# Patient Record
Sex: Male | Born: 1935 | Race: White | Hispanic: No | Smoking: Current every day smoker
Health system: Southern US, Community
[De-identification: ages and names within clinical notes are randomized; demographics above are authoritative.]

## PROBLEM LIST (undated history)

## (undated) DIAGNOSIS — M4800 Spinal stenosis, site unspecified: Secondary | ICD-10-CM

## (undated) DIAGNOSIS — I2510 Atherosclerotic heart disease of native coronary artery without angina pectoris: Secondary | ICD-10-CM

## (undated) DIAGNOSIS — E1149 Type 2 diabetes mellitus with other diabetic neurological complication: Secondary | ICD-10-CM

## (undated) DIAGNOSIS — F329 Major depressive disorder, single episode, unspecified: Secondary | ICD-10-CM

## (undated) DIAGNOSIS — F0391 Unspecified dementia with behavioral disturbance: Secondary | ICD-10-CM

## (undated) DIAGNOSIS — F0151 Vascular dementia with behavioral disturbance: Secondary | ICD-10-CM

## (undated) DIAGNOSIS — F32A Depression, unspecified: Secondary | ICD-10-CM

## (undated) DIAGNOSIS — G609 Hereditary and idiopathic neuropathy, unspecified: Secondary | ICD-10-CM

## (undated) DIAGNOSIS — R404 Transient alteration of awareness: Secondary | ICD-10-CM

## (undated) DIAGNOSIS — N400 Enlarged prostate without lower urinary tract symptoms: Secondary | ICD-10-CM

## (undated) DIAGNOSIS — I10 Essential (primary) hypertension: Secondary | ICD-10-CM

## (undated) DIAGNOSIS — F03918 Unspecified dementia, unspecified severity, with other behavioral disturbance: Secondary | ICD-10-CM

## (undated) DIAGNOSIS — Z720 Tobacco use: Secondary | ICD-10-CM

## (undated) DIAGNOSIS — J449 Chronic obstructive pulmonary disease, unspecified: Secondary | ICD-10-CM

## (undated) DIAGNOSIS — J4489 Other specified chronic obstructive pulmonary disease: Secondary | ICD-10-CM

## (undated) DIAGNOSIS — R413 Other amnesia: Secondary | ICD-10-CM

## (undated) DIAGNOSIS — E119 Type 2 diabetes mellitus without complications: Secondary | ICD-10-CM

## (undated) DIAGNOSIS — F0153 Vascular dementia, unspecified severity, with mood disturbance: Secondary | ICD-10-CM

## (undated) DIAGNOSIS — F419 Anxiety disorder, unspecified: Secondary | ICD-10-CM

## (undated) DIAGNOSIS — F0150 Vascular dementia without behavioral disturbance: Secondary | ICD-10-CM

## (undated) DIAGNOSIS — F0390 Unspecified dementia without behavioral disturbance: Secondary | ICD-10-CM

## (undated) DIAGNOSIS — I219 Acute myocardial infarction, unspecified: Secondary | ICD-10-CM

## (undated) HISTORY — DX: Other amnesia: R41.3

## (undated) HISTORY — DX: Benign prostatic hyperplasia without lower urinary tract symptoms: N40.0

## (undated) HISTORY — DX: Chronic obstructive pulmonary disease, unspecified: J44.9

## (undated) HISTORY — DX: Unspecified dementia with behavioral disturbance: F03.91

## (undated) HISTORY — DX: Spinal stenosis, site unspecified: M48.00

## (undated) HISTORY — DX: Transient alteration of awareness: R40.4

## (undated) HISTORY — DX: Major depressive disorder, single episode, unspecified: F32.9

## (undated) HISTORY — DX: Vascular dementia without behavioral disturbance: F01.50

## (undated) HISTORY — DX: Hereditary and idiopathic neuropathy, unspecified: G60.9

## (undated) HISTORY — DX: Unspecified dementia, unspecified severity, with other behavioral disturbance: F03.918

## (undated) HISTORY — DX: Other specified chronic obstructive pulmonary disease: J44.89

## (undated) HISTORY — PX: CATARACT EXTRACTION: SUR2

## (undated) HISTORY — DX: Vascular dementia, unspecified severity, with mood disturbance: F01.53

## (undated) HISTORY — DX: Vascular dementia with behavioral disturbance: F01.51

## (undated) HISTORY — DX: Type 2 diabetes mellitus with other diabetic neurological complication: E11.49

## (undated) HISTORY — DX: Depression, unspecified: F32.A

## (undated) HISTORY — PX: CORONARY STENT PLACEMENT: SHX1402

---

## 2001-06-06 HISTORY — PX: LAMINECTOMY THORACIC FUSION POSTERIOR: SHX5887

## 2007-06-07 HISTORY — PX: CARDIAC CATHETERIZATION: SHX172

## 2011-10-05 ENCOUNTER — Emergency Department (HOSPITAL_COMMUNITY): Payer: Medicare HMO

## 2011-10-05 ENCOUNTER — Emergency Department (HOSPITAL_COMMUNITY)
Admission: EM | Admit: 2011-10-05 | Discharge: 2011-10-05 | Disposition: A | Discharge status: Home / Self Care | Payer: Medicare HMO | Attending: Emergency Medicine | Admitting: Emergency Medicine

## 2011-10-05 DIAGNOSIS — M545 Low back pain, unspecified: Secondary | ICD-10-CM | POA: Insufficient documentation

## 2011-10-05 DIAGNOSIS — R319 Hematuria, unspecified: Secondary | ICD-10-CM | POA: Insufficient documentation

## 2011-10-05 DIAGNOSIS — R5381 Other malaise: Secondary | ICD-10-CM | POA: Insufficient documentation

## 2011-10-05 DIAGNOSIS — M79609 Pain in unspecified limb: Secondary | ICD-10-CM | POA: Insufficient documentation

## 2011-10-05 DIAGNOSIS — R29898 Other symptoms and signs involving the musculoskeletal system: Secondary | ICD-10-CM | POA: Insufficient documentation

## 2011-10-05 DIAGNOSIS — G8929 Other chronic pain: Secondary | ICD-10-CM | POA: Insufficient documentation

## 2011-10-05 LAB — URINE MICROSCOPIC-ADD ON

## 2011-10-05 LAB — BASIC METABOLIC PANEL
Calcium: 9.3 mg/dL (ref 8.4–10.5)
GFR calc non Af Amer: 83 mL/min — ABNORMAL LOW (ref >90)
Sodium: 144 mEq/L (ref 135–145)

## 2011-10-05 LAB — CBC
MCH: 31 pg (ref 26.0–34.0)
MCV: 91.3 fL (ref 78.0–100.0)
Platelets: 277 10*3/uL (ref 150–400)
RDW: 16.3 % — ABNORMAL HIGH (ref 11.5–15.5)
WBC: 9.1 10*3/uL (ref 4.0–10.5)

## 2011-10-05 LAB — URINALYSIS, ROUTINE W REFLEX MICROSCOPIC
Protein, ur: 30 mg/dL — AB
Urobilinogen, UA: 1 mg/dL (ref 0.0–1.0)

## 2011-10-05 LAB — DIFFERENTIAL
Basophils Absolute: 0.1 10*3/uL (ref 0.0–0.1)
Eosinophils Absolute: 0.2 10*3/uL (ref 0.0–0.7)
Eosinophils Relative: 2 % (ref 0–5)

## 2011-10-05 MED ORDER — HYDROMORPHONE HCL PF 1 MG/ML IJ SOLN
1.0000 mg | Freq: Once | INTRAMUSCULAR | Status: AC
  Administered 2011-10-05: 1 mg via INTRAMUSCULAR
  Filled 2011-10-05: qty 1

## 2011-10-05 MED ORDER — HYDROCODONE-ACETAMINOPHEN 5-325 MG PO TABS: 1.0000 | ORAL_TABLET | Freq: Four times a day (QID) | ORAL | Status: AC | PRN

## 2011-10-05 MED ORDER — KETOROLAC TROMETHAMINE 30 MG/ML IJ SOLN
30.0000 mg | Freq: Once | INTRAMUSCULAR | Status: AC
  Administered 2011-10-05: 30 mg via INTRAMUSCULAR
  Filled 2011-10-05: qty 1

## 2011-10-05 NOTE — ED Provider Notes (Signed)
History     CSN: [id number]  Arrival date & time 10/05/11  1500   First MD Initiated Contact with Patient 10/05/11 1521      Chief Complaint  Patient presents with  . Weakness    (Consider location/radiation/quality/duration/timing/severity/associated sxs/prior treatment) HPI History provided by patient and his son.  Patient's son reports that pt has had chronic low back pain for the past 20 years for which he has had multiple surgeries.  Over the past month, his pain has become progressively worse, more prominent on left w/ radiation down left leg, and is associated w/ LE weakness.  He is very unsteady on his feet, even with his walker.  He witnessed him collapse in the parking lot of store today, and it appeared that his legs gave out on him.  Pt says that his legs have given out on him in the past but infrequently.  No associated fever, bowel/bladder dysfunction or LE paresthesias.  Denies having weakness anywhere else.  Denies dizziness and vision changes.  No recent illnesses including cough, vomiting, diarrhea, dysuria.  Pt is on multiple pain medications.   No past medical history on file.  No past surgical history on file.  No family history on file.  History  Substance Use Topics  . Smoking status: Not on file  . Smokeless tobacco: Not on file  . Alcohol Use: Not on file      Review of Systems  All other systems reviewed and are negative.    Allergies  Review of patient's allergies indicates not on file.  Home Medications  No current outpatient prescriptions on file.  BP 114/57  Pulse 72  Temp(Src) 99.4 F (37.4 C) (Oral)  Resp 22  SpO2 94%  Physical Exam  Nursing note and vitals reviewed. Constitutional: He is oriented to person, place, and time. He appears well-developed and well-nourished.  HENT:  Head: Normocephalic and atraumatic.  Eyes:       Normal appearance  Neck: Normal range of motion.  Cardiovascular: Normal rate and regular rhythm.     Pulmonary/Chest: Effort normal and breath sounds normal.  Genitourinary:       No CVA ttp  Musculoskeletal:       Lumbar spinal and paraspinal ttp. Full active ROM of LE w/out increased back pain.  Decreased patellar reflexes.  No saddle anesthesia.  Distal sensation intact.  2+ DP pulses.  Nml rectal tone.  Neurological: He is alert and oriented to person, place, and time.  Skin: Skin is warm and dry. No rash noted.  Psychiatric: He has a normal mood and affect. His behavior is normal.    ED Course  Procedures (including critical care time)  Labs Reviewed  URINALYSIS, ROUTINE W REFLEX MICROSCOPIC - Abnormal; Notable for the following:    APPearance CLOUDY (*)    Hgb urine dipstick MODERATE (*)    Protein, ur 30 (*)    All other components within normal limits  CBC - Abnormal; Notable for the following:    HCT 38.6 (*)    RDW 16.3 (*)    All other components within normal limits  DIFFERENTIAL - Abnormal; Notable for the following:    Monocytes Relative 13 (*)    Monocytes Absolute 1.2 (*)    All other components within normal limits  BASIC METABOLIC PANEL - Abnormal; Notable for the following:    GFR calc non Af Amer 83 (*)    All other components within normal limits  URINE MICROSCOPIC-ADD ON  Mr Lumbar Spine Wo Contrast  10/05/2011  *RADIOLOGY REPORT*  Clinical Data: Fall.  Low back pain.  Left leg weakness  MRI LUMBAR SPINE WITHOUT CONTRAST  Technique:  Multiplanar and multiecho pulse sequences of the lumbar spine were obtained without intravenous contrast.  Comparison: None.  Findings: Negative for fracture.  No bone marrow or soft tissue edema is seen.  Negative for mass lesion.  Conus medullaris is normal and terminates at L1.  T12-L1:  Mild disc and mild facet degeneration without spinal stenosis.  L1-2:  Diffuse disc bulging and spondylosis.  Moderate facet hypertrophy bilaterally with moderate spinal stenosis.  Mild foraminal narrowing bilaterally.  L2-3:  Diffuse bulging  of the disc with associated vertebral spurring.  Moderate facet and ligamentum flavum hypertrophy with moderately severe central canal stenosis.  Moderate lateral recess and foraminal stenosis bilaterally.  L3-4:  11 mm anterior slip.  Advanced disc degeneration and spondylosis.  Moderate to severe spinal stenosis.  Moderate lateral recess and foraminal encroachment bilaterally.  L4-5:  15 mm anterior slip with  bilateral pars defect of L4. Diffuse disc bulging and vertebral spurring.  Moderate spinal stenosis.  Lateral recess and foraminal encroachment bilaterally which is moderate to severe in nature.  L5-S1:  Disc and facet degeneration without significant disc protrusion or spinal stenosis.  IMPRESSION: Moderate spinal stenosis at L1-2 and L2-3.  Moderate to severe spinal stenosis at L3-4 with 11 mm anterior slip.  Lateral recess and foraminal encroachment bilaterally.  15 mm anterior slip L4-L5 with bilateral pars defects of L4. Moderate spinal stenosis and moderate to severe foraminal encroachment bilaterally.  Original Report Authenticated By: Camelia Phenes, M.D.     1. Low back pain   2. Hematuria       MDM  78yo M w/ chronic low back pain presents w/ acutely worsened pain x 1 month w/ increasing LE weakness.  His family reports that he is very unsteady on his feet, especially over the last 48hrs.  Had a witnessed fall in which his legs gave out on him today.  Pt initially denied urinary/bowel sx but stated later that he is has had urinary retention for the past couple months.  On exam, no fever, mid-line spinal tenderness, NV deficits or decreased rectal tone.  Patellar tendons hyporeflexive bilaterally.   Pt received IM toradol and dilaudid for pain.  He reports being comfortable when is not attempting to ambulate.  MRI lumbar spine ordered to r/o spinal stenosis and cauda equina and Dr. Brooke Dare has ordered basic labs as well.    Labs sig for hematuria.  Will instruct pt to f/u with PCP for  recheck.  MRI shows spinal stenosis throughout lumbar spine, moderate to severe between L3 and L5, + mod-sev bilateral foraminal encroachment L4-L5. Results discussed w/ pt and his son.  Pt is eager to go home.   Will ambulate and if he does well, will d/c home w/ vicodin for pain and referral to NS. He understands the importance of close f/u.  Return precautions discussed.  Kirichenko, PA-C to dispo.          Otilio Miu, [place] 10/06/11 [phone]

## 2011-10-05 NOTE — ED Notes (Signed)
First contact with pt. Pt resting in bed quietly. Pt's son is at bedside. Pt is alert at this time. Pt requesting pain medicine at this time. Pt notified and aware of need for md order

## 2011-10-05 NOTE — ED Notes (Signed)
[id number]> Expected date:<BR> Expected time:<BR> Means of arrival:<BR> Comments:<BR> From Triage

## 2011-10-05 NOTE — ED Notes (Signed)
Pt son states he will be in the cafeteria and if we need him to call him Zagal) at [phone]

## 2011-10-05 NOTE — ED Notes (Signed)
Pt awaiting mri

## 2011-10-05 NOTE — ED Notes (Addendum)
Family brings pt from out shoping, pt lives in [place] [address] [place], family states pt has been increasingly deteriorating for past month, family states pt has trouble walking pt co of weakness. Pt fell today from standing position in parking lot while shopping on to left side, pt co of left hip and foot pain. Family states pt legs just gave out.

## 2011-10-05 NOTE — Discharge Instructions (Signed)
Take vicodin as prescribed for severe pain.   Do not drive within four hours of taking this medication (may cause drowsiness or confusion).  Follow up with Dr. Newell Coral as soon as possible.  You should return to the ER if you develop fever, increased weakness in legs or loss of control of bladder/bowels.   Please follow up with your primary care doctor as well for red blood cells in your urine. You should have this rechecked.

## 2011-10-05 NOTE — ED Notes (Signed)
Unable to obtain patient's vital signs due to patient in MRI. Will attempt another time

## 2011-10-05 NOTE — ED Notes (Signed)
Pt transported to mri 

## 2011-10-06 NOTE — ED Provider Notes (Signed)
Medical screening examination/treatment/procedure(s) were conducted as a shared visit with non-physician practitioner(s) and myself.  I personally evaluated the patient during the encounter  Back pain with intermittent leg weakness resulting in falls bilaterally.  Has had some urinary retention as well.  MR with spinal and foraminal stenosis.  Was able to walk around ed at baseline.  Refuses surgical interventions.  Will instruct to follow up with NS  Dayton Bailiff, MD 10/06/11 2038

## 2012-05-04 ENCOUNTER — Encounter (HOSPITAL_COMMUNITY): Payer: Self-pay | Admitting: Emergency Medicine

## 2012-05-04 ENCOUNTER — Emergency Department (HOSPITAL_COMMUNITY): Payer: Medicare HMO

## 2012-05-04 ENCOUNTER — Emergency Department (HOSPITAL_COMMUNITY)
Admission: EM | Admit: 2012-05-04 | Discharge: 2012-05-04 | Disposition: A | Discharge status: Home / Self Care | Payer: Medicare HMO | Attending: Emergency Medicine | Admitting: Emergency Medicine

## 2012-05-04 DIAGNOSIS — H5789 Other specified disorders of eye and adnexa: Secondary | ICD-10-CM | POA: Insufficient documentation

## 2012-05-04 DIAGNOSIS — J3489 Other specified disorders of nose and nasal sinuses: Secondary | ICD-10-CM | POA: Insufficient documentation

## 2012-05-04 DIAGNOSIS — E119 Type 2 diabetes mellitus without complications: Secondary | ICD-10-CM | POA: Insufficient documentation

## 2012-05-04 DIAGNOSIS — F17200 Nicotine dependence, unspecified, uncomplicated: Secondary | ICD-10-CM | POA: Insufficient documentation

## 2012-05-04 DIAGNOSIS — I2510 Atherosclerotic heart disease of native coronary artery without angina pectoris: Secondary | ICD-10-CM | POA: Insufficient documentation

## 2012-05-04 DIAGNOSIS — I10 Essential (primary) hypertension: Secondary | ICD-10-CM | POA: Insufficient documentation

## 2012-05-04 DIAGNOSIS — I252 Old myocardial infarction: Secondary | ICD-10-CM | POA: Insufficient documentation

## 2012-05-04 DIAGNOSIS — F0390 Unspecified dementia without behavioral disturbance: Secondary | ICD-10-CM | POA: Insufficient documentation

## 2012-05-04 DIAGNOSIS — R05 Cough: Secondary | ICD-10-CM

## 2012-05-04 DIAGNOSIS — Z79899 Other long term (current) drug therapy: Secondary | ICD-10-CM | POA: Insufficient documentation

## 2012-05-04 DIAGNOSIS — R059 Cough, unspecified: Secondary | ICD-10-CM | POA: Insufficient documentation

## 2012-05-04 HISTORY — DX: Major depressive disorder, single episode, unspecified: F32.9

## 2012-05-04 HISTORY — DX: Type 2 diabetes mellitus without complications: E11.9

## 2012-05-04 HISTORY — DX: Depression, unspecified: F32.A

## 2012-05-04 HISTORY — DX: Anxiety disorder, unspecified: F41.9

## 2012-05-04 HISTORY — DX: Atherosclerotic heart disease of native coronary artery without angina pectoris: I25.10

## 2012-05-04 HISTORY — DX: Tobacco use: Z72.0

## 2012-05-04 HISTORY — DX: Essential (primary) hypertension: I10

## 2012-05-04 HISTORY — DX: Unspecified dementia, unspecified severity, without behavioral disturbance, psychotic disturbance, mood disturbance, and anxiety: F03.90

## 2012-05-04 HISTORY — DX: Acute myocardial infarction, unspecified: I21.9

## 2012-05-04 LAB — CBC WITH DIFFERENTIAL/PLATELET
Eosinophils Relative: 2 % (ref 0–5)
HCT: 37.4 % — ABNORMAL LOW (ref 39.0–52.0)
Lymphocytes Relative: 22 % (ref 12–46)
Lymphs Abs: 1.9 10*3/uL (ref 0.7–4.0)
MCV: 91.4 fL (ref 78.0–100.0)
Neutro Abs: 5.4 10*3/uL (ref 1.7–7.7)
Platelets: 261 10*3/uL (ref 150–400)
RBC: 4.09 MIL/uL — ABNORMAL LOW (ref 4.22–5.81)
WBC: 8.7 10*3/uL (ref 4.0–10.5)

## 2012-05-04 LAB — COMPREHENSIVE METABOLIC PANEL
Albumin: 3.9 g/dL (ref 3.5–5.2)
BUN: 15 mg/dL (ref 6–23)
Creatinine, Ser: 0.78 mg/dL (ref 0.50–1.35)
Total Bilirubin: 0.3 mg/dL (ref 0.3–1.2)
Total Protein: 6.8 g/dL (ref 6.0–8.3)

## 2012-05-04 LAB — POCT I-STAT TROPONIN I
Troponin i, poc: 0 ng/mL (ref 0.00–0.08)
Troponin i, poc: 0.01 ng/mL (ref 0.00–0.08)

## 2012-05-04 NOTE — ED Notes (Signed)
PT. ARRIVE WITH EMS FROM [facility] REPORTS SOB WITH NASAL CONGESTION AND OCCASIONAL PRODUCTIVE COUGH WITH CHEST TIGHTNESS , DENIES CHEST PAIN , POOR HISTORIAN , NO MEDS PRIOR TO ARRIVAL .

## 2012-05-04 NOTE — ED Notes (Signed)
MD at bedside. 

## 2012-05-04 NOTE — ED Notes (Signed)
TRANSPORTED TO X-RAY. 

## 2012-05-04 NOTE — ED Provider Notes (Signed)
History     CSN: [id number]  Arrival date & time 05/04/12  6295   None     Chief Complaint  Patient presents with  . Shortness of Breath    (Consider location/radiation/quality/duration/timing/severity/associated sxs/prior treatment) HPI The patient presents to the ED with congestion. Additional history was provided by the patient's wife due to patients history of dementia. The patient reports watching a football game last night and when smoking a cigarette he had some throat irritation due to post nasal drip.  The non-productive coughing started this morning.  He reports R eye clear discharge.  Denies chest pain or SOB.  Denies fever or chills, nausea or vomiting.  Patients is currently at his baseline mentation per patient's wife.    Past Medical History  Diagnosis Date  . Tobacco abuse   . Coronary artery disease   . MI (myocardial infarction)   . Dementia   . Diabetes mellitus without complication   . Hypertension     Past Surgical History  Procedure Date  . Coronary stent placement     No family history on file.  History  Substance Use Topics  . Smoking status: Current Every Day Smoker  . Smokeless tobacco: Not on file  . Alcohol Use: No      Review of Systems All other systems negative except as documented in the HPI. All pertinent positives and negatives as reviewed in the HPI.  Allergies  Review of patient's allergies indicates not on file.  Home Medications   Current Outpatient Rx  Name  Route  Sig  Dispense  Refill  . ATENOLOL 50 MG PO TABS   Oral   Take 50 mg by mouth daily.         . ATORVASTATIN CALCIUM 80 MG PO TABS   Oral   Take 80 mg by mouth daily.         Marland Kitchen BACLOFEN 10 MG PO TABS   Oral   Take 10 mg by mouth every 6 (six) hours.         Marland Kitchen VITAMIN D 1000 UNITS PO TABS   Oral   Take 1,000 Units by mouth 2 (two) times daily.          Marland Kitchen CITALOPRAM HYDROBROMIDE 20 MG PO TABS   Oral   Take 20 mg by mouth daily.         Marland Kitchen  CLOPIDOGREL BISULFATE 75 MG PO TABS   Oral   Take 75 mg by mouth daily.         Marland Kitchen GLIMEPIRIDE 2 MG PO TABS   Oral   Take 2 mg by mouth daily before breakfast.         . METFORMIN HCL 1000 MG PO TABS   Oral   Take 1,000 mg by mouth 2 (two) times daily with a meal.         . NAPROXEN SODIUM 220 MG PO TABS   Oral   Take 440 mg by mouth 2 (two) times daily with a meal.          . MELATONIN PO   Oral   Take 1 tablet by mouth at bedtime.         Marland Kitchen PIOGLITAZONE HCL 30 MG PO TABS   Oral   Take 30 mg by mouth daily.         Marland Kitchen TIOTROPIUM BROMIDE MONOHYDRATE 18 MCG IN CAPS   Inhalation   Place 18 mcg into inhaler and inhale daily. For shortness  of breath.         . TRAMADOL HCL 50 MG PO TABS   Oral   Take 50 mg by mouth every 6 (six) hours as needed. For pain.         Marland Kitchen ZINC SULFATE 220 MG PO CAPS   Oral   Take 220 mg by mouth daily.           There were no vitals taken for this visit.  Physical Exam  Vitals reviewed. Constitutional: He appears well-developed and well-nourished.  HENT:  Head: Normocephalic and atraumatic.  Nose: Rhinorrhea present.  Mouth/Throat: No oropharyngeal exudate or posterior oropharyngeal edema.       Multiple abrasions on various healing stages on nose, perioral, and central forehead.  L TM obscured to cerumen  Cardiovascular: Normal rate, regular rhythm, S1 normal and S2 normal.  Exam reveals no S3, no S4, no distant heart sounds and no friction rub.   Murmur heard.  Systolic murmur is present with a grade of 2/6       Early systolic at the [place]  Pulmonary/Chest: Effort normal and breath sounds normal. No accessory muscle usage. Not tachypneic. No respiratory distress. He has no decreased breath sounds. He has no wheezes. He has no rhonchi. He has no rales.  Abdominal: Soft. Normal appearance. There is no tenderness. There is no rigidity and no guarding.  Lymphadenopathy:       Head (right side): No submental, no submandibular,  no tonsillar and no occipital adenopathy present.       Head (left side): No submental, no submandibular, no tonsillar and no occipital adenopathy present.    He has no cervical adenopathy.  Neurological: He is alert.       Oriented to person  Skin: Skin is warm and dry.    ED Course  Procedures (including critical care time)  The patient has been having no evidence of SOB or hypoxia here in the ER. The patient has cardiac disease but based on the HPI the patient is not having symptoms that would be the same symptoms he had previously with his heart related issues. The patient denies SOB or chest pain. The patient does have two negative enzymes. The patient does not have any history that would concern me for PE at this time. The wife is asked to follow up with his doctor. The patient had nasal congestion and drainage and then when he coughed the patient has clear phlegm.    MDM    MDM Reviewed: nursing note, vitals and previous chart Reviewed previous: labs and ECG Interpretation: labs, ECG and x-ray    Date: 05/04/2012  Rate: 62  Rhythm: normal sinus rhythm  QRS Axis: normal  Intervals: normal  ST/T Wave abnormalities: normal  Conduction Disutrbances:right bundle branch block  Narrative Interpretation:   Old EKG Reviewed: unchanged         Carlyle Dolly, PA-C 05/04/12 1052

## 2012-05-05 NOTE — ED Provider Notes (Signed)
Medical screening examination/treatment/procedure(s) were performed by non-physician practitioner and as supervising physician I was immediately available for consultation/collaboration.  Danijela Vessey, MD 05/05/12 0653 

## 2012-09-04 ENCOUNTER — Ambulatory Visit (INDEPENDENT_AMBULATORY_CARE_PROVIDER_SITE_OTHER): Payer: Medicare Other | Admitting: Nurse Practitioner

## 2012-09-04 ENCOUNTER — Encounter: Payer: Self-pay | Admitting: Nurse Practitioner

## 2012-09-04 VITALS — BP 118/60 | HR 76 | Temp 97.7°F | Wt 182.0 lb

## 2012-09-04 DIAGNOSIS — M7989 Other specified soft tissue disorders: Secondary | ICD-10-CM | POA: Insufficient documentation

## 2012-09-04 DIAGNOSIS — F03918 Unspecified dementia, unspecified severity, with other behavioral disturbance: Secondary | ICD-10-CM

## 2012-09-04 DIAGNOSIS — F0391 Unspecified dementia with behavioral disturbance: Secondary | ICD-10-CM | POA: Insufficient documentation

## 2012-09-04 MED ORDER — RISPERIDONE 0.25 MG PO TABS: 0.2500 mg | ORAL_TABLET | Freq: Two times a day (BID) | ORAL | Status: DC
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
## 2012-09-04 NOTE — Assessment & Plan Note (Signed)
At this time will get xray of right wrist and hand. May splint hand and use ice as needed.
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
## 2012-10-01 ENCOUNTER — Other Ambulatory Visit: Payer: Self-pay | Admitting: *Deleted

## 2012-10-01 MED ORDER — METFORMIN HCL 1000 MG PO TABS: 1000.0000 mg | ORAL_TABLET | Freq: Two times a day (BID) | ORAL | Status: DC

## 2012-10-03 ENCOUNTER — Encounter: Payer: Self-pay | Admitting: Nurse Practitioner

## 2012-10-19 ENCOUNTER — Other Ambulatory Visit: Payer: Self-pay | Admitting: Internal Medicine

## 2012-10-19 ENCOUNTER — Other Ambulatory Visit: Payer: Self-pay | Admitting: Nurse Practitioner

## 2012-10-24 ENCOUNTER — Other Ambulatory Visit: Payer: Self-pay | Admitting: *Deleted

## 2012-10-24 MED ORDER — CLONAZEPAM 0.5 MG PO TABS: ORAL_TABLET | ORAL | Status: DC

## 2012-12-31 ENCOUNTER — Other Ambulatory Visit: Payer: Self-pay | Admitting: Geriatric Medicine

## 2013-01-08 ENCOUNTER — Other Ambulatory Visit: Payer: Self-pay | Admitting: Geriatric Medicine

## 2013-01-08 MED ORDER — TRAMADOL HCL 50 MG PO TABS: ORAL_TABLET | ORAL | Status: DC

## 2013-03-08 ENCOUNTER — Other Ambulatory Visit: Payer: Self-pay | Admitting: *Deleted

## 2013-04-01 ENCOUNTER — Encounter (HOSPITAL_COMMUNITY): Payer: Self-pay | Admitting: Emergency Medicine

## 2013-04-01 ENCOUNTER — Emergency Department (HOSPITAL_COMMUNITY): Payer: Medicare HMO

## 2013-04-01 ENCOUNTER — Emergency Department (HOSPITAL_COMMUNITY)
Admission: EM | Admit: 2013-04-01 | Discharge: 2013-04-01 | Disposition: A | Discharge status: Home / Self Care | Payer: Medicare HMO | Attending: Emergency Medicine | Admitting: Emergency Medicine

## 2013-04-01 DIAGNOSIS — Z79899 Other long term (current) drug therapy: Secondary | ICD-10-CM | POA: Insufficient documentation

## 2013-04-01 DIAGNOSIS — S60511A Abrasion of right hand, initial encounter: Secondary | ICD-10-CM

## 2013-04-01 DIAGNOSIS — F0153 Vascular dementia, unspecified severity, with mood disturbance: Secondary | ICD-10-CM | POA: Insufficient documentation

## 2013-04-01 DIAGNOSIS — I252 Old myocardial infarction: Secondary | ICD-10-CM | POA: Insufficient documentation

## 2013-04-01 DIAGNOSIS — Z9889 Other specified postprocedural states: Secondary | ICD-10-CM | POA: Insufficient documentation

## 2013-04-01 DIAGNOSIS — R296 Repeated falls: Secondary | ICD-10-CM | POA: Insufficient documentation

## 2013-04-01 DIAGNOSIS — N390 Urinary tract infection, site not specified: Secondary | ICD-10-CM | POA: Diagnosis present

## 2013-04-01 DIAGNOSIS — Z23 Encounter for immunization: Secondary | ICD-10-CM | POA: Insufficient documentation

## 2013-04-01 DIAGNOSIS — J449 Chronic obstructive pulmonary disease, unspecified: Secondary | ICD-10-CM | POA: Insufficient documentation

## 2013-04-01 DIAGNOSIS — W19XXXA Unspecified fall, initial encounter: Secondary | ICD-10-CM

## 2013-04-01 DIAGNOSIS — G609 Hereditary and idiopathic neuropathy, unspecified: Secondary | ICD-10-CM | POA: Insufficient documentation

## 2013-04-01 DIAGNOSIS — F411 Generalized anxiety disorder: Secondary | ICD-10-CM | POA: Insufficient documentation

## 2013-04-01 DIAGNOSIS — J4489 Other specified chronic obstructive pulmonary disease: Secondary | ICD-10-CM | POA: Insufficient documentation

## 2013-04-01 DIAGNOSIS — Z7902 Long term (current) use of antithrombotics/antiplatelets: Secondary | ICD-10-CM | POA: Insufficient documentation

## 2013-04-01 DIAGNOSIS — I2510 Atherosclerotic heart disease of native coronary artery without angina pectoris: Secondary | ICD-10-CM | POA: Insufficient documentation

## 2013-04-01 DIAGNOSIS — Y9389 Activity, other specified: Secondary | ICD-10-CM | POA: Insufficient documentation

## 2013-04-01 DIAGNOSIS — Y92009 Unspecified place in unspecified non-institutional (private) residence as the place of occurrence of the external cause: Secondary | ICD-10-CM | POA: Insufficient documentation

## 2013-04-01 DIAGNOSIS — I10 Essential (primary) hypertension: Secondary | ICD-10-CM | POA: Insufficient documentation

## 2013-04-01 DIAGNOSIS — S60519A Abrasion of unspecified hand, initial encounter: Secondary | ICD-10-CM

## 2013-04-01 DIAGNOSIS — R413 Other amnesia: Secondary | ICD-10-CM | POA: Insufficient documentation

## 2013-04-01 DIAGNOSIS — F0151 Vascular dementia with behavioral disturbance: Secondary | ICD-10-CM | POA: Insufficient documentation

## 2013-04-01 DIAGNOSIS — S60512A Abrasion of left hand, initial encounter: Secondary | ICD-10-CM

## 2013-04-01 DIAGNOSIS — Z9861 Coronary angioplasty status: Secondary | ICD-10-CM | POA: Insufficient documentation

## 2013-04-01 DIAGNOSIS — F329 Major depressive disorder, single episode, unspecified: Secondary | ICD-10-CM | POA: Insufficient documentation

## 2013-04-01 DIAGNOSIS — F17200 Nicotine dependence, unspecified, uncomplicated: Secondary | ICD-10-CM | POA: Insufficient documentation

## 2013-04-01 DIAGNOSIS — F3289 Other specified depressive episodes: Secondary | ICD-10-CM | POA: Insufficient documentation

## 2013-04-01 DIAGNOSIS — IMO0002 Reserved for concepts with insufficient information to code with codable children: Secondary | ICD-10-CM | POA: Insufficient documentation

## 2013-04-01 DIAGNOSIS — E1149 Type 2 diabetes mellitus with other diabetic neurological complication: Secondary | ICD-10-CM | POA: Insufficient documentation

## 2013-04-01 LAB — COMPREHENSIVE METABOLIC PANEL
Albumin: 3.3 g/dL — ABNORMAL LOW (ref 3.5–5.2)
BUN: 14 mg/dL (ref 6–23)
Calcium: 9.2 mg/dL (ref 8.4–10.5)
Creatinine, Ser: 0.64 mg/dL (ref 0.50–1.35)
Total Bilirubin: 0.3 mg/dL (ref 0.3–1.2)
Total Protein: 6.2 g/dL (ref 6.0–8.3)

## 2013-04-01 LAB — URINALYSIS W MICROSCOPIC + REFLEX CULTURE
Ketones, ur: NEGATIVE mg/dL
Nitrite: POSITIVE — AB
Specific Gravity, Urine: 1.022 (ref 1.005–1.030)
pH: 6.5 (ref 5.0–8.0)

## 2013-04-01 LAB — CBC WITH DIFFERENTIAL/PLATELET
Basophils Relative: 1 % (ref 0–1)
Eosinophils Absolute: 0.1 10*3/uL (ref 0.0–0.7)
Eosinophils Relative: 1 % (ref 0–5)
HCT: 36 % — ABNORMAL LOW (ref 39.0–52.0)
Hemoglobin: 12.3 g/dL — ABNORMAL LOW (ref 13.0–17.0)
MCH: 30.4 pg (ref 26.0–34.0)
MCHC: 34.2 g/dL (ref 30.0–36.0)
Monocytes Absolute: 1.2 10*3/uL — ABNORMAL HIGH (ref 0.1–1.0)
Monocytes Relative: 14 % — ABNORMAL HIGH (ref 3–12)
RDW: 15.6 % — ABNORMAL HIGH (ref 11.5–15.5)

## 2013-04-01 LAB — LIPASE, BLOOD: Lipase: 24 U/L (ref 11–59)

## 2013-04-01 MED ORDER — CEFPODOXIME PROXETIL 200 MG PO TABS
200.0000 mg | ORAL_TABLET | Freq: Once | ORAL | Status: DC
  Filled 2013-04-01: qty 1

## 2013-04-01 MED ORDER — CEFPODOXIME PROXETIL 200 MG PO TABS: 200.0000 mg | ORAL_TABLET | Freq: Two times a day (BID) | ORAL | Status: AC

## 2013-04-01 MED ORDER — TETANUS-DIPHTH-ACELL PERTUSSIS 5-2.5-18.5 LF-MCG/0.5 IM SUSP
0.5000 mL | Freq: Once | INTRAMUSCULAR | Status: AC
  Administered 2013-04-01: 0.5 mL via INTRAMUSCULAR
  Filled 2013-04-01: qty 0.5

## 2013-04-01 NOTE — Progress Notes (Signed)
   CARE MANAGEMENT ED NOTE 04/01/2013  Patient:  Jeremy Alvarez, Jeremy Alvarez   Account Number:  [id number]  Date Initiated:  04/01/2013  Documentation initiated by:  Edd Arbour  Subjective/Objective Assessment:   c/o fall at facility [facility] listed in facility information is dr Elmarie Shiley reed as pcp     Subjective/Objective Assessment Detail:     Action/Plan:   pt unable to provide assist to name of pcp, updated EPIC   Action/Plan Detail:   Anticipated DC Date:  04/01/2013     Status Recommendation to Physician:   Result of Recommendation:    Other ED Services  Consult Working Plan    DC Planning Services  Other  PCP issues  Outpatient Services - Pt will follow up    Choice offered to / List presented to:            Status of service:  Completed, signed off  ED Comments:   ED Comments Detail:

## 2013-04-01 NOTE — ED Notes (Signed)
Per EMS: Pt from [facility].  Was found on the floor.  Pt denies complaint.  No thinners.  Staff states he has had an awkward gait the last 5 days and and he has had yellowing of the left side of his head and hands that they want to get checked out.

## 2013-04-01 NOTE — ED Notes (Signed)
Pt trying to get up out of bed.  Assisted pt getting dressed and back into bed.  Informed pt we are waiting on radiology results before he can go home and/or eat.

## 2013-04-01 NOTE — ED Notes (Signed)
Bed: RESA Expected date:  Expected time:  Means of arrival:  Comments: Rollover, head/neck/back pain

## 2013-04-01 NOTE — ED Provider Notes (Signed)
CSN: [id number]     Arrival date & time 04/01/13  1015 History   First MD Initiated Contact with Patient 04/01/13 1030     Chief Complaint  Patient presents with  . Fall   (Consider location/radiation/quality/duration/timing/severity/associated sxs/prior Treatment) Patient is a 79 y.o. male presenting with fall. The history is provided by the patient.  Fall This is a new problem. The current episode started 1 to 2 hours ago. Episode frequency: once. The problem has been resolved. Pertinent negatives include no chest pain, no abdominal pain, no headaches and no shortness of breath. Nothing aggravates the symptoms. Nothing relieves the symptoms. He has tried nothing for the symptoms. The treatment provided no relief.    Past Medical History  Diagnosis Date  . Tobacco abuse   . Coronary artery disease   . MI (myocardial infarction)   . Dementia   . Diabetes mellitus without complication   . Hypertension   . Anxiety   . Depression   . Chronic airway obstruction, not elsewhere classified   . Unspecified hereditary and idiopathic peripheral neuropathy   . Spinal stenosis, unspecified region other than cervical   . Memory loss   . Type II or unspecified type diabetes mellitus with neurological manifestations, not stated as uncontrolled(250.60)   . Other alteration of consciousness   . Vascular dementia with depressed mood   . Hypertrophy of prostate without urinary obstruction and other lower urinary tract symptoms (LUTS)   . Dementia with behavioral disturbance    Past Surgical History  Procedure Laterality Date  . Coronary stent placement    . Cataract extraction      bilateral  . Laminectomy thoracic fusion posterior  2003  . Cardiac catheterization  2009   Family History  Problem Relation Age of Onset  . Cancer Son     Non Hodgkins, Lymphoma   History  Substance Use Topics  . Smoking status: Current Every Day Smoker -- 68 years    Types: Cigarettes  . Smokeless  tobacco: Never Used  . Alcohol Use: No    Review of Systems  Constitutional: Negative for fever.  HENT: Negative for drooling and rhinorrhea.   Eyes: Negative for pain.  Respiratory: Negative for cough and shortness of breath.   Cardiovascular: Negative for chest pain and leg swelling.  Gastrointestinal: Negative for nausea, vomiting, abdominal pain and diarrhea.  Genitourinary: Negative for dysuria and hematuria.  Musculoskeletal: Negative for gait problem and neck pain.  Skin: Negative for color change.  Neurological: Negative for numbness and headaches.  Hematological: Negative for adenopathy.  Psychiatric/Behavioral: Negative for behavioral problems.  All other systems reviewed and are negative.    Allergies  Aspirin  Home Medications   Current Outpatient Rx  Name  Route  Sig  Dispense  Refill  . acetaminophen (TYLENOL) 500 MG tablet   Oral   Take 1,000 mg by mouth 4 (four) times daily.         Marland Kitchen atenolol (TENORMIN) 50 MG tablet   Oral   Take 50 mg by mouth every morning.          . baclofen (LIORESAL) 10 MG tablet   Oral   Take 10 mg by mouth. Take one at bedtime         . citalopram (CELEXA) 20 MG tablet   Oral   Take 20 mg by mouth every morning.          . clonazePAM (KLONOPIN) 0.5 MG tablet      Take  one tablet at bedtime for anxiety   30 tablet   5   . clopidogrel (PLAVIX) 75 MG tablet   Oral   Take 75 mg by mouth every morning.          . donepezil (ARICEPT) 10 MG tablet   Oral   Take 10 mg by mouth at bedtime.         Marland Kitchen lisinopril (PRINIVIL,ZESTRIL) 10 MG tablet   Oral   Take 10 mg by mouth every morning.         Marland Kitchen MELATONIN PO   Oral   Take 1 tablet by mouth at bedtime.         . memantine (NAMENDA) 5 MG tablet   Oral   Take 5 mg by mouth 2 (two) times daily.         . metFORMIN (GLUCOPHAGE) 1000 MG tablet   Oral   Take 1 tablet (1,000 mg total) by mouth 2 (two) times daily with a meal.   60 tablet   6   .  pioglitazone (ACTOS) 30 MG tablet   Oral   Take 30 mg by mouth every morning.          . risperiDONE (RISPERDAL) 0.25 MG tablet   Oral   Take 1 tablet (0.25 mg total) by mouth 2 (two) times daily. 1 tablet in the morning and 1 tablet before dinner   60 tablet   3   . tamsulosin (FLOMAX) 0.4 MG CAPS      TAKE ONE CAPSULE BY MOUTH EVERY NIGHT AT BEDTIME   90 capsule   0     **Patient requests 90 days supply**   . traMADol (ULTRAM) 50 MG tablet      Take one tablet by mouth daily.   30 tablet   5    BP 118/60  Pulse 62  Temp(Src) 97.7 F (36.5 C) (Oral)  Resp 14  SpO2 94% Physical Exam  Nursing note and vitals reviewed. Constitutional: He is oriented to person, place, and time. He appears well-developed and well-nourished.  HENT:  Head: Normocephalic and atraumatic.  Right Ear: External ear normal.  Left Ear: External ear normal.  Nose: Nose normal.  Mouth/Throat: Oropharynx is clear and moist. No oropharyngeal exudate.  No sublingual jaundice noted.   No obvious yellowing of sclera.   Eyes: Conjunctivae and EOM are normal. Pupils are equal, round, and reactive to light.  Neck: Normal range of motion. Neck supple.  Cardiovascular: Normal rate, regular rhythm, normal heart sounds and intact distal pulses.  Exam reveals no gallop and no friction rub.   No murmur heard. Pulmonary/Chest: Effort normal and breath sounds normal. No respiratory distress. He has no wheezes.  Abdominal: Soft. Bowel sounds are normal. He exhibits no distension. There is no tenderness. There is no rebound and no guarding.  Musculoskeletal: Normal range of motion. He exhibits no edema and no tenderness.  Mild abrasions to palm of right hand and over 4th mcp of left hand.   Neurological: He is alert and oriented to person, place, and time.  Skin: Skin is warm and dry.  No obvious jaundice.   Psychiatric: He has a normal mood and affect. His behavior is normal.    ED Course  Procedures  (including critical care time) Labs Review Labs Reviewed  CBC WITH DIFFERENTIAL - Abnormal; Notable for the following:    RBC 4.05 (*)    Hemoglobin 12.3 (*)    HCT 36.0 (*)  RDW 15.6 (*)    Monocytes Relative 14 (*)    Monocytes Absolute 1.2 (*)    All other components within normal limits  COMPREHENSIVE METABOLIC PANEL - Abnormal; Notable for the following:    Glucose, Bld 113 (*)    Albumin 3.3 (*)    GFR calc non Af Amer 89 (*)    All other components within normal limits  URINALYSIS W MICROSCOPIC + REFLEX CULTURE - Abnormal; Notable for the following:    APPearance CLOUDY (*)    Hgb urine dipstick TRACE (*)    Nitrite POSITIVE (*)    Leukocytes, UA LARGE (*)    Bacteria, UA MANY (*)    All other components within normal limits  URINE CULTURE  LIPASE, BLOOD   Imaging Review Dg Pelvis 1-2 Views  04/01/2013   CLINICAL DATA:  Larey Seat. Pelvic pain.  EXAM: PELVIS - 1-2 VIEW  COMPARISON:  None.  FINDINGS: Both hips are normally located. Moderate degenerative changes. No acute hip fracture. The pubic symphysis and SI joints are intact. No pelvic fractures. Extensive vascular calcifications are noted.  IMPRESSION: No acute bony findings.   Electronically Signed   By: Loralie Champagne M.D.   On: 04/01/2013 11:46   Ct Head Wo Contrast  04/01/2013   CLINICAL DATA:  Fall  EXAM: CT HEAD WITHOUT CONTRAST  TECHNIQUE: Contiguous axial images were obtained from the base of the skull through the vertex without intravenous contrast.  COMPARISON:  None.  FINDINGS: No mass effect, midline shift, or acute intracranial hemorrhage. Global atrophy is noted. Mild chronic ischemic changes in the periventricular white matter. Atherosclerotic disease in the carotid vasculature.  There is a metallic object within the right external auditory canal which may represent a hearing aid. Trace fluid in the right mastoid air cells.  Nasal septum is deviated to the left.  Cranium is intact.  IMPRESSION: Chronic ischemic  changes and atrophy. Metal object in the right external auditory canal may simply represent a hearing aid.   Electronically Signed   By: Maryclare Bean M.D.   On: 04/01/2013 12:04   Dg Hand Complete Left  04/01/2013   CLINICAL DATA:  Larey Seat. Left hand pain.  EXAM: LEFT HAND - COMPLETE 3+ VIEW  COMPARISON:  None.  FINDINGS: The joint spaces are maintained. No acute fracture. Mild degenerative changes and mild diffuse osteoporosis.  IMPRESSION: No acute bony findings.   Electronically Signed   By: Loralie Champagne M.D.   On: 04/01/2013 11:27   Dg Hand Complete Right  04/01/2013   CLINICAL DATA:  Larey Seat. Injured right hand.  EXAM: RIGHT HAND - COMPLETE 3+ VIEW  COMPARISON:  None.  FINDINGS: The joint spaces are fairly well maintained. No acute bony findings. Mild be age related osteopenia.  IMPRESSION: No acute bony findings.   Electronically Signed   By: Loralie Champagne M.D.   On: 04/01/2013 11:29    EKG Interpretation     Ventricular Rate:  59 PR Interval:  209 QRS Duration: 134 QT Interval:  452 QTC Calculation: 448 R Axis:   -79 Text Interpretation:  Sinus rhythm RBBB and LAFB No significant change since last tracing            MDM   1. Fall, initial encounter   2. Abrasion, hand, right, initial encounter   3. Abrasion, hand, left, initial encounter   4. UTI (lower urinary tract infection)    10:43 AM 79 y.o. male who presents with what he describes as a  mechanical fall from standing while exiting his room this morning. He denies passing out or hitting his head. He denies loss of consciousness. He denies any pain and has no complaints here. Per EMS he was found on the floor shortly after the fall. Per EMS is no the patient has had some yellowing of the left side of his head and hands and the facility would like this evaluated. Will get screening labwork and imaging.  1:24 PM: Pt continues to appear well, ambulates, eating lunch. Found to have UTI, will tx w/ vantin. Labs/imaging  otherwise non-contrib. VS unremarkable. I have discussed the diagnosis/risks/treatment options with the patient and believe the pt to be eligible for discharge home to follow-up with pcp as needed. We also discussed returning to the ED immediately if new or worsening sx occur. We discussed the sx which are most concerning (e.g., further falls) that necessitate immediate return. Any new prescriptions provided to the patient are listed below.  Discharge Medication List as of 04/01/2013  1:26 PM    START taking these medications   Details  cefpodoxime (VANTIN) 200 MG tablet Take 1 tablet (200 mg total) by mouth 2 (two) times daily., Starting 04/01/2013, Last dose on Mon 04/08/13, Print         Junius Argyle, MD 04/01/13 [phone]

## 2013-04-02 ENCOUNTER — Telehealth (HOSPITAL_COMMUNITY): Payer: Self-pay

## 2013-04-02 NOTE — ED Notes (Signed)
Calling for Rx information for pt prescribed with UTI.  Copy of MD note faxed to facility [phone] Attn Aram Beecham

## 2013-04-03 LAB — URINE CULTURE: Colony Count: >=100000

## 2013-04-25 ENCOUNTER — Other Ambulatory Visit: Payer: Self-pay

## 2013-04-25 MED ORDER — CLONAZEPAM 0.5 MG PO TABS: 0.5000 mg | ORAL_TABLET | Freq: Every day | ORAL | Status: DC

## 2013-06-17 ENCOUNTER — Encounter: Payer: Self-pay | Admitting: Internal Medicine

## 2013-06-27 ENCOUNTER — Emergency Department (HOSPITAL_COMMUNITY)
Admission: EM | Admit: 2013-06-27 | Discharge: 2013-06-27 | Disposition: A | Discharge status: Home / Self Care | Payer: Medicare HMO | Source: Home / Self Care | Attending: Emergency Medicine | Admitting: Emergency Medicine

## 2013-06-27 ENCOUNTER — Encounter (HOSPITAL_COMMUNITY): Payer: Self-pay | Admitting: Emergency Medicine

## 2013-06-27 DIAGNOSIS — F0151 Vascular dementia with behavioral disturbance: Secondary | ICD-10-CM | POA: Insufficient documentation

## 2013-06-27 DIAGNOSIS — Z8739 Personal history of other diseases of the musculoskeletal system and connective tissue: Secondary | ICD-10-CM

## 2013-06-27 DIAGNOSIS — F411 Generalized anxiety disorder: Secondary | ICD-10-CM

## 2013-06-27 DIAGNOSIS — I252 Old myocardial infarction: Secondary | ICD-10-CM

## 2013-06-27 DIAGNOSIS — N400 Enlarged prostate without lower urinary tract symptoms: Secondary | ICD-10-CM | POA: Insufficient documentation

## 2013-06-27 DIAGNOSIS — F0153 Vascular dementia, unspecified severity, with mood disturbance: Secondary | ICD-10-CM | POA: Insufficient documentation

## 2013-06-27 DIAGNOSIS — Z23 Encounter for immunization: Secondary | ICD-10-CM | POA: Insufficient documentation

## 2013-06-27 DIAGNOSIS — F329 Major depressive disorder, single episode, unspecified: Secondary | ICD-10-CM

## 2013-06-27 DIAGNOSIS — S41112A Laceration without foreign body of left upper arm, initial encounter: Secondary | ICD-10-CM

## 2013-06-27 DIAGNOSIS — S41109A Unspecified open wound of unspecified upper arm, initial encounter: Secondary | ICD-10-CM

## 2013-06-27 DIAGNOSIS — Y9389 Activity, other specified: Secondary | ICD-10-CM

## 2013-06-27 DIAGNOSIS — I10 Essential (primary) hypertension: Secondary | ICD-10-CM | POA: Insufficient documentation

## 2013-06-27 DIAGNOSIS — Z7982 Long term (current) use of aspirin: Secondary | ICD-10-CM | POA: Insufficient documentation

## 2013-06-27 DIAGNOSIS — Z9861 Coronary angioplasty status: Secondary | ICD-10-CM | POA: Insufficient documentation

## 2013-06-27 DIAGNOSIS — Z9889 Other specified postprocedural states: Secondary | ICD-10-CM | POA: Insufficient documentation

## 2013-06-27 DIAGNOSIS — E1149 Type 2 diabetes mellitus with other diabetic neurological complication: Secondary | ICD-10-CM

## 2013-06-27 DIAGNOSIS — Z79899 Other long term (current) drug therapy: Secondary | ICD-10-CM

## 2013-06-27 DIAGNOSIS — J4489 Other specified chronic obstructive pulmonary disease: Secondary | ICD-10-CM | POA: Insufficient documentation

## 2013-06-27 DIAGNOSIS — I2510 Atherosclerotic heart disease of native coronary artery without angina pectoris: Secondary | ICD-10-CM

## 2013-06-27 DIAGNOSIS — X58XXXA Exposure to other specified factors, initial encounter: Secondary | ICD-10-CM | POA: Insufficient documentation

## 2013-06-27 DIAGNOSIS — Y929 Unspecified place or not applicable: Secondary | ICD-10-CM

## 2013-06-27 DIAGNOSIS — S41111A Laceration without foreign body of right upper arm, initial encounter: Secondary | ICD-10-CM

## 2013-06-27 DIAGNOSIS — J449 Chronic obstructive pulmonary disease, unspecified: Secondary | ICD-10-CM

## 2013-06-27 DIAGNOSIS — F17200 Nicotine dependence, unspecified, uncomplicated: Secondary | ICD-10-CM

## 2013-06-27 DIAGNOSIS — F3289 Other specified depressive episodes: Secondary | ICD-10-CM | POA: Insufficient documentation

## 2013-06-27 MED ORDER — TETANUS-DIPHTH-ACELL PERTUSSIS 5-2.5-18.5 LF-MCG/0.5 IM SUSP
0.5000 mL | Freq: Once | INTRAMUSCULAR | Status: AC
  Administered 2013-06-27: 0.5 mL via INTRAMUSCULAR
  Filled 2013-06-27: qty 0.5

## 2013-06-27 NOTE — ED Notes (Signed)
Assisted nurse with wound dressing.

## 2013-06-27 NOTE — ED Notes (Signed)
[facility]  Pt has hx of dementia- staff reports at night pt excessively scratches his arms, tonight they were unable to get the bleeding to stop.  Pressure dressings reapplied upon arrival to ED, skin tears noted throughout left and right arm.  Bleeding controlled at present.

## 2013-06-27 NOTE — ED Provider Notes (Signed)
CSN: [id number]     Arrival date & time 06/27/13  2048 History   First MD Initiated Contact with Patient 06/27/13 2102     Chief Complaint  Patient presents with  . Abrasion    The history is provided by the patient.  pt presents from nursing facility for skin tears Pt has h/o dementia  Per nursing home staff pt will excessively scratch his arms and tonight he did this so much his arms were bleeding His course is worsening.  They applied pressure to wounds but the wound continued to bleed so he was sent for evaluation  Pt currently denies any complaints - he denies cp/sob/HA/weakness   Past Medical History  Diagnosis Date  . Tobacco abuse   . Coronary artery disease   . MI (myocardial infarction)   . Dementia   . Diabetes mellitus without complication   . Hypertension   . Anxiety   . Depression   . Chronic airway obstruction, not elsewhere classified   . Unspecified hereditary and idiopathic peripheral neuropathy   . Spinal stenosis, unspecified region other than cervical   . Memory loss   . Type II or unspecified type diabetes mellitus with neurological manifestations, not stated as uncontrolled   . Other alteration of consciousness   . Vascular dementia with depressed mood   . Hypertrophy of prostate without urinary obstruction and other lower urinary tract symptoms (LUTS)   . Dementia with behavioral disturbance    Past Surgical History  Procedure Laterality Date  . Coronary stent placement    . Cataract extraction      bilateral  . Laminectomy thoracic fusion posterior  2003  . Cardiac catheterization  2009   Family History  Problem Relation Age of Onset  . Cancer Son     Non Hodgkins, Lymphoma   History  Substance Use Topics  . Smoking status: Current Every Day Smoker -- 68 years    Types: Cigarettes  . Smokeless tobacco: Never Used  . Alcohol Use: No    Review of Systems  Unable to perform ROS: Dementia    Allergies  Aspirin  Home Medications    Current Outpatient Rx  Name  Route  Sig  Dispense  Refill  . acetaminophen (MAPAP) 500 MG tablet   Oral   Take 1,000 mg by mouth 3 (three) times daily as needed for pain.         Marland Kitchen. aspirin 81 MG chewable tablet   Oral   Chew 81 mg by mouth every morning.         Marland Kitchen. atenolol (TENORMIN) 50 MG tablet   Oral   Take 50 mg by mouth every morning.          . carbamazepine (TEGRETOL) 200 MG tablet   Oral   Take 400-600 mg by mouth 2 (two) times daily. Takes 400mg  in the morning and 600mg  at night         . divalproex (DEPAKOTE) 500 MG DR tablet   Oral   Take 750-1,000 mg by mouth 2 (two) times daily. Takes 750mg  in the morning and 1000mg  every night         . donepezil (ARICEPT) 10 MG tablet   Oral   Take 10 mg by mouth at bedtime.         . gabapentin (NEURONTIN) 300 MG capsule   Oral   Take 300-600 mg by mouth 2 (two) times daily. Takes 300mg  in the morning and 600mg  at bedtime         .  lisinopril (PRINIVIL,ZESTRIL) 10 MG tablet   Oral   Take 10 mg by mouth every morning.         . medroxyPROGESTERone (PROVERA) 10 MG tablet   Oral   Take 50 mg by mouth 2 (two) times daily.         . Memantine HCl ER (NAMENDA XR) 28 MG CP24   Oral   Take 28 mg by mouth daily.         . metFORMIN (GLUCOPHAGE) 1000 MG tablet   Oral   Take 1,000 mg by mouth 2 (two) times daily with a meal.         . PARoxetine (PAXIL) 40 MG tablet   Oral   Take 40 mg by mouth at bedtime.         . risperiDONE (RISPERDAL) 0.5 MG tablet   Oral   Take 0.5 mg by mouth 2 (two) times daily.         . sitaGLIPtin (JANUVIA) 100 MG tablet   Oral   Take 100 mg by mouth every morning.         . tamsulosin (FLOMAX) 0.4 MG CAPS capsule   Oral   Take 0.4 mg by mouth at bedtime.          BP 138/58  Pulse 64  Temp(Src) 97.4 F (36.3 C) (Oral)  Resp 18  SpO2 99% Physical Exam CONSTITUTIONAL: elderly, no distress noted HEAD: Normocephalic/atraumatic EYES: EOMI/PERRL ENMT:  Mucous membranes moist NECK: supple no meningeal signs CV: S1/S2 noted LUNGS: Lungs are clear to auscultation bilaterally, no apparent distress ABDOMEN: soft, nontender, no rebound or guarding NEURO: Pt is awake/alert, moves all extremitiesx4. He is pleasantly demented EXTREMITIES: pulses normal, full ROM SKIN: warm, color normal. He has large skin tears to both upper extremities.  Bleeding is controlled.  No erythema is noted.  There is no deep structure identified in any of the wounds.   He can range both upper extremities without difficulty PSYCH: no abnormalities of mood noted  ED Course  Procedures (including critical care time) Labs Review Labs Reviewed - No data to display Imaging Review No results found.  EKG Interpretation   None       MDM   1. Skin tear of left upper arm without complication   2. Skin tear of right upper arm without complication    Nursing notes including past medical history and social history reviewed and considered in documentation  I spoke to nursing facility They report pt did not stop scratching wounds and they could not control bleeding Bleeding is now controlled Wound dressed by nursing Stable for d/c home    Joya Gaskins, MD 06/27/13 2313

## 2013-06-29 ENCOUNTER — Emergency Department (HOSPITAL_COMMUNITY): Payer: Medicare HMO

## 2013-06-29 ENCOUNTER — Inpatient Hospital Stay (HOSPITAL_COMMUNITY)
Admission: EM | Admit: 2013-06-29 | Discharge: 2013-07-05 | DRG: 309 | Disposition: A | Discharge status: Nursing Home (Includes ICF) | Payer: Medicare HMO | Attending: Internal Medicine | Admitting: Internal Medicine

## 2013-06-29 ENCOUNTER — Encounter (HOSPITAL_COMMUNITY): Payer: Self-pay | Admitting: Emergency Medicine

## 2013-06-29 DIAGNOSIS — I672 Cerebral atherosclerosis: Secondary | ICD-10-CM | POA: Diagnosis present

## 2013-06-29 DIAGNOSIS — J449 Chronic obstructive pulmonary disease, unspecified: Secondary | ICD-10-CM | POA: Diagnosis present

## 2013-06-29 DIAGNOSIS — E119 Type 2 diabetes mellitus without complications: Secondary | ICD-10-CM

## 2013-06-29 DIAGNOSIS — F3289 Other specified depressive episodes: Secondary | ICD-10-CM | POA: Diagnosis present

## 2013-06-29 DIAGNOSIS — I10 Essential (primary) hypertension: Secondary | ICD-10-CM | POA: Diagnosis present

## 2013-06-29 DIAGNOSIS — F0153 Vascular dementia, unspecified severity, with mood disturbance: Secondary | ICD-10-CM | POA: Diagnosis present

## 2013-06-29 DIAGNOSIS — J4489 Other specified chronic obstructive pulmonary disease: Secondary | ICD-10-CM | POA: Diagnosis present

## 2013-06-29 DIAGNOSIS — F0391 Unspecified dementia with behavioral disturbance: Secondary | ICD-10-CM | POA: Diagnosis present

## 2013-06-29 DIAGNOSIS — Z23 Encounter for immunization: Secondary | ICD-10-CM

## 2013-06-29 DIAGNOSIS — N390 Urinary tract infection, site not specified: Secondary | ICD-10-CM | POA: Diagnosis present

## 2013-06-29 DIAGNOSIS — E1149 Type 2 diabetes mellitus with other diabetic neurological complication: Secondary | ICD-10-CM | POA: Diagnosis present

## 2013-06-29 DIAGNOSIS — I2510 Atherosclerotic heart disease of native coronary artery without angina pectoris: Secondary | ICD-10-CM | POA: Diagnosis present

## 2013-06-29 DIAGNOSIS — E871 Hypo-osmolality and hyponatremia: Secondary | ICD-10-CM | POA: Diagnosis present

## 2013-06-29 DIAGNOSIS — Z79899 Other long term (current) drug therapy: Secondary | ICD-10-CM

## 2013-06-29 DIAGNOSIS — Z7982 Long term (current) use of aspirin: Secondary | ICD-10-CM

## 2013-06-29 DIAGNOSIS — N400 Enlarged prostate without lower urinary tract symptoms: Secondary | ICD-10-CM | POA: Diagnosis present

## 2013-06-29 DIAGNOSIS — Z807 Family history of other malignant neoplasms of lymphoid, hematopoietic and related tissues: Secondary | ICD-10-CM

## 2013-06-29 DIAGNOSIS — G608 Other hereditary and idiopathic neuropathies: Secondary | ICD-10-CM | POA: Diagnosis present

## 2013-06-29 DIAGNOSIS — IMO0002 Reserved for concepts with insufficient information to code with codable children: Secondary | ICD-10-CM

## 2013-06-29 DIAGNOSIS — F411 Generalized anxiety disorder: Secondary | ICD-10-CM | POA: Diagnosis present

## 2013-06-29 DIAGNOSIS — W19XXXA Unspecified fall, initial encounter: Secondary | ICD-10-CM | POA: Diagnosis present

## 2013-06-29 DIAGNOSIS — F329 Major depressive disorder, single episode, unspecified: Secondary | ICD-10-CM | POA: Diagnosis present

## 2013-06-29 DIAGNOSIS — Z66 Do not resuscitate: Secondary | ICD-10-CM | POA: Diagnosis not present

## 2013-06-29 DIAGNOSIS — F0151 Vascular dementia with behavioral disturbance: Secondary | ICD-10-CM | POA: Diagnosis present

## 2013-06-29 DIAGNOSIS — I252 Old myocardial infarction: Secondary | ICD-10-CM

## 2013-06-29 DIAGNOSIS — S60519A Abrasion of unspecified hand, initial encounter: Secondary | ICD-10-CM

## 2013-06-29 DIAGNOSIS — F03918 Unspecified dementia, unspecified severity, with other behavioral disturbance: Secondary | ICD-10-CM | POA: Diagnosis present

## 2013-06-29 DIAGNOSIS — F0150 Vascular dementia without behavioral disturbance: Secondary | ICD-10-CM | POA: Diagnosis present

## 2013-06-29 DIAGNOSIS — F17200 Nicotine dependence, unspecified, uncomplicated: Secondary | ICD-10-CM | POA: Diagnosis present

## 2013-06-29 DIAGNOSIS — I4891 Unspecified atrial fibrillation: Principal | ICD-10-CM | POA: Diagnosis present

## 2013-06-29 DIAGNOSIS — R4182 Altered mental status, unspecified: Secondary | ICD-10-CM | POA: Diagnosis present

## 2013-06-29 LAB — CBC WITH DIFFERENTIAL/PLATELET
Basophils Absolute: 0 10*3/uL (ref 0.0–0.1)
Basophils Relative: 0 % (ref 0–1)
Eosinophils Absolute: 0.3 10*3/uL (ref 0.0–0.7)
Eosinophils Relative: 3 % (ref 0–5)
HEMATOCRIT: 31.9 % — AB (ref 39.0–52.0)
Hemoglobin: 10.9 g/dL — ABNORMAL LOW (ref 13.0–17.0)
Lymphocytes Relative: 11 % — ABNORMAL LOW (ref 12–46)
Lymphs Abs: 1.1 10*3/uL (ref 0.7–4.0)
MCH: 30.6 pg (ref 26.0–34.0)
MCHC: 34.2 g/dL (ref 30.0–36.0)
MCV: 89.6 fL (ref 78.0–100.0)
Monocytes Absolute: 1.6 10*3/uL — ABNORMAL HIGH (ref 0.1–1.0)
Monocytes Relative: 16 % — ABNORMAL HIGH (ref 3–12)
NEUTROS ABS: 6.9 10*3/uL (ref 1.7–7.7)
NEUTROS PCT: 69 % (ref 43–77)
Platelets: 244 10*3/uL (ref 150–400)
RBC: 3.56 MIL/uL — ABNORMAL LOW (ref 4.22–5.81)
RDW: 15.8 % — AB (ref 11.5–15.5)
WBC: 10 10*3/uL (ref 4.0–10.5)

## 2013-06-29 LAB — URINALYSIS, ROUTINE W REFLEX MICROSCOPIC
Bilirubin Urine: NEGATIVE
GLUCOSE, UA: NEGATIVE mg/dL
HGB URINE DIPSTICK: NEGATIVE
Ketones, ur: NEGATIVE mg/dL
Nitrite: POSITIVE — AB
Protein, ur: NEGATIVE mg/dL
Specific Gravity, Urine: 1.02 (ref 1.005–1.030)
Urobilinogen, UA: 1 mg/dL (ref 0.0–1.0)
pH: 5.5 (ref 5.0–8.0)

## 2013-06-29 LAB — URINE MICROSCOPIC-ADD ON

## 2013-06-29 LAB — COMPREHENSIVE METABOLIC PANEL
ALBUMIN: 3.1 g/dL — AB (ref 3.5–5.2)
ALT: 18 U/L (ref 0–53)
AST: 29 U/L (ref 0–37)
Alkaline Phosphatase: 60 U/L (ref 39–117)
BILIRUBIN TOTAL: 0.2 mg/dL — AB (ref 0.3–1.2)
BUN: 17 mg/dL (ref 6–23)
CHLORIDE: 92 meq/L — AB (ref 96–112)
CO2: 19 meq/L (ref 19–32)
Calcium: 8.6 mg/dL (ref 8.4–10.5)
Creatinine, Ser: 0.8 mg/dL (ref 0.50–1.35)
GFR calc Af Amer: >90 mL/min (ref >90)
GFR, EST NON AFRICAN AMERICAN: 81 mL/min — AB (ref >90)
Glucose, Bld: 123 mg/dL — ABNORMAL HIGH (ref 70–99)
POTASSIUM: 5.3 meq/L (ref 3.7–5.3)
Sodium: 131 mEq/L — ABNORMAL LOW (ref 137–147)
Total Protein: 6.2 g/dL (ref 6.0–8.3)

## 2013-06-29 LAB — TROPONIN I
Troponin I: <0.3 ng/mL (ref <0.30)
Troponin I: <0.3 ng/mL (ref <0.30)

## 2013-06-29 LAB — GLUCOSE, CAPILLARY
GLUCOSE-CAPILLARY: 150 mg/dL — AB (ref 70–99)
Glucose-Capillary: 151 mg/dL — ABNORMAL HIGH (ref 70–99)

## 2013-06-29 LAB — PRO B NATRIURETIC PEPTIDE: Pro B Natriuretic peptide (BNP): 680.4 pg/mL — ABNORMAL HIGH (ref 0–450)

## 2013-06-29 MED ORDER — ATENOLOL 50 MG PO TABS
50.0000 mg | ORAL_TABLET | Freq: Every morning | ORAL | Status: DC
  Administered 2013-06-30 – 2013-07-05 (×6): 50 mg via ORAL
  Filled 2013-06-29 (×6): qty 1

## 2013-06-29 MED ORDER — MORPHINE SULFATE 2 MG/ML IJ SOLN
2.0000 mg | INTRAMUSCULAR | Status: DC | PRN
  Administered 2013-07-01: 2 mg via INTRAVENOUS
  Filled 2013-06-29: qty 1

## 2013-06-29 MED ORDER — PAROXETINE HCL 20 MG PO TABS
40.0000 mg | ORAL_TABLET | Freq: Every day | ORAL | Status: DC
  Administered 2013-06-29 – 2013-07-04 (×6): 40 mg via ORAL
  Filled 2013-06-29 (×7): qty 2

## 2013-06-29 MED ORDER — GABAPENTIN 300 MG PO CAPS
300.0000 mg | ORAL_CAPSULE | Freq: Every day | ORAL | Status: DC
  Administered 2013-06-30 – 2013-07-05 (×6): 300 mg via ORAL
  Filled 2013-06-29 (×6): qty 1

## 2013-06-29 MED ORDER — PNEUMOCOCCAL VAC POLYVALENT 25 MCG/0.5ML IJ INJ: 0.5000 mL | INJECTION | INTRAMUSCULAR | Status: DC | PRN

## 2013-06-29 MED ORDER — INSULIN ASPART 100 UNIT/ML ~~LOC~~ SOLN
0.0000 [IU] | Freq: Three times a day (TID) | SUBCUTANEOUS | Status: DC
  Administered 2013-06-29: 3 [IU] via SUBCUTANEOUS
  Administered 2013-06-30 – 2013-07-02 (×5): 2 [IU] via SUBCUTANEOUS
  Administered 2013-07-03: 3 [IU] via SUBCUTANEOUS
  Administered 2013-07-03 – 2013-07-04 (×4): 2 [IU] via SUBCUTANEOUS
  Administered 2013-07-05: 3 [IU] via SUBCUTANEOUS

## 2013-06-29 MED ORDER — DIVALPROEX SODIUM 500 MG PO DR TAB
750.0000 mg | DELAYED_RELEASE_TABLET | Freq: Two times a day (BID) | ORAL | Status: DC
Start: 2013-06-29 — End: 2013-06-29
  Filled 2013-06-29: qty 2

## 2013-06-29 MED ORDER — SODIUM CHLORIDE 0.9 % IV SOLN
INTRAVENOUS | Status: DC
  Administered 2013-06-29 – 2013-06-30 (×3): via INTRAVENOUS
  Administered 2013-07-01: 75 mL/h via INTRAVENOUS
  Administered 2013-07-02: 01:00:00 via INTRAVENOUS

## 2013-06-29 MED ORDER — ASPIRIN EC 325 MG PO TBEC
325.0000 mg | DELAYED_RELEASE_TABLET | Freq: Every day | ORAL | Status: DC
  Administered 2013-06-29 – 2013-07-05 (×7): 325 mg via ORAL
  Filled 2013-06-29 (×7): qty 1

## 2013-06-29 MED ORDER — MEMANTINE HCL ER 28 MG PO CP24: 28.0000 mg | ORAL_CAPSULE | Freq: Every day | ORAL | Status: DC

## 2013-06-29 MED ORDER — DIVALPROEX SODIUM 500 MG PO DR TAB
750.0000 mg | DELAYED_RELEASE_TABLET | Freq: Every day | ORAL | Status: DC
  Administered 2013-06-30 – 2013-07-05 (×6): 750 mg via ORAL
  Filled 2013-06-29 (×6): qty 1

## 2013-06-29 MED ORDER — MEDROXYPROGESTERONE ACETATE 10 MG PO TABS
50.0000 mg | ORAL_TABLET | Freq: Two times a day (BID) | ORAL | Status: DC
  Administered 2013-06-29 – 2013-07-05 (×12): 50 mg via ORAL
  Filled 2013-06-29 (×15): qty 5

## 2013-06-29 MED ORDER — SODIUM CHLORIDE 0.9 % IV SOLN: INTRAVENOUS | Status: AC

## 2013-06-29 MED ORDER — ACETAMINOPHEN 650 MG RE SUPP: 650.0000 mg | Freq: Four times a day (QID) | RECTAL | Status: DC | PRN

## 2013-06-29 MED ORDER — DEXTROSE 5 % IV SOLN
1.0000 g | Freq: Once | INTRAVENOUS | Status: AC
  Administered 2013-06-29: 1 g via INTRAVENOUS
  Filled 2013-06-29: qty 10

## 2013-06-29 MED ORDER — DONEPEZIL HCL 10 MG PO TABS
10.0000 mg | ORAL_TABLET | Freq: Every day | ORAL | Status: DC
  Administered 2013-06-29 – 2013-07-04 (×6): 10 mg via ORAL
  Filled 2013-06-29 (×7): qty 1

## 2013-06-29 MED ORDER — MEMANTINE HCL ER 7 MG PO CP24
28.0000 mg | ORAL_CAPSULE | Freq: Every day | ORAL | Status: DC
  Administered 2013-06-30 – 2013-07-05 (×6): 28 mg via ORAL
  Filled 2013-06-29 (×8): qty 4

## 2013-06-29 MED ORDER — GABAPENTIN 300 MG PO CAPS
600.0000 mg | ORAL_CAPSULE | Freq: Every day | ORAL | Status: DC
  Administered 2013-06-29 – 2013-07-04 (×6): 600 mg via ORAL
  Filled 2013-06-29 (×7): qty 2

## 2013-06-29 MED ORDER — ACETAMINOPHEN 325 MG PO TABS: 650.0000 mg | ORAL_TABLET | Freq: Four times a day (QID) | ORAL | Status: DC | PRN

## 2013-06-29 MED ORDER — ONDANSETRON HCL 4 MG/2ML IJ SOLN: 4.0000 mg | Freq: Four times a day (QID) | INTRAMUSCULAR | Status: DC | PRN

## 2013-06-29 MED ORDER — CARBAMAZEPINE 200 MG PO TABS
400.0000 mg | ORAL_TABLET | Freq: Every day | ORAL | Status: DC
  Administered 2013-06-30 – 2013-07-05 (×6): 400 mg via ORAL
  Filled 2013-06-29 (×6): qty 2

## 2013-06-29 MED ORDER — INSULIN ASPART 100 UNIT/ML ~~LOC~~ SOLN: 0.0000 [IU] | Freq: Every day | SUBCUTANEOUS | Status: DC

## 2013-06-29 MED ORDER — LISINOPRIL 10 MG PO TABS
10.0000 mg | ORAL_TABLET | Freq: Every morning | ORAL | Status: DC
  Administered 2013-06-30 – 2013-07-02 (×3): 10 mg via ORAL
  Filled 2013-06-29 (×3): qty 1

## 2013-06-29 MED ORDER — CARBAMAZEPINE 200 MG PO TABS
600.0000 mg | ORAL_TABLET | Freq: Every day | ORAL | Status: DC
  Administered 2013-06-29 – 2013-07-04 (×6): 600 mg via ORAL
  Filled 2013-06-29 (×7): qty 3

## 2013-06-29 MED ORDER — TAMSULOSIN HCL 0.4 MG PO CAPS
0.4000 mg | ORAL_CAPSULE | Freq: Every day | ORAL | Status: DC
  Administered 2013-06-29 – 2013-07-04 (×6): 0.4 mg via ORAL
  Filled 2013-06-29 (×7): qty 1

## 2013-06-29 MED ORDER — DEXTROSE 5 % IV SOLN
1.0000 g | INTRAVENOUS | Status: DC
  Administered 2013-06-30 – 2013-07-02 (×3): 1 g via INTRAVENOUS
  Filled 2013-06-29 (×4): qty 10

## 2013-06-29 MED ORDER — DIVALPROEX SODIUM 500 MG PO DR TAB
1000.0000 mg | DELAYED_RELEASE_TABLET | Freq: Every day | ORAL | Status: DC
  Administered 2013-06-29 – 2013-07-04 (×6): 1000 mg via ORAL
  Filled 2013-06-29 (×8): qty 2

## 2013-06-29 MED ORDER — SODIUM CHLORIDE 0.9 % IJ SOLN
3.0000 mL | Freq: Two times a day (BID) | INTRAMUSCULAR | Status: DC
  Administered 2013-07-01 – 2013-07-02 (×2): 3 mL via INTRAVENOUS

## 2013-06-29 MED ORDER — ONDANSETRON HCL 4 MG PO TABS: 4.0000 mg | ORAL_TABLET | Freq: Four times a day (QID) | ORAL | Status: DC | PRN

## 2013-06-29 MED ORDER — RISPERIDONE 0.5 MG PO TABS
0.5000 mg | ORAL_TABLET | Freq: Two times a day (BID) | ORAL | Status: DC
  Administered 2013-06-29 – 2013-07-05 (×13): 0.5 mg via ORAL
  Filled 2013-06-29 (×15): qty 1

## 2013-06-29 MED ORDER — HEPARIN SODIUM (PORCINE) 5000 UNIT/ML IJ SOLN
5000.0000 [IU] | Freq: Three times a day (TID) | INTRAMUSCULAR | Status: DC
  Administered 2013-06-29 – 2013-07-05 (×17): 5000 [IU] via SUBCUTANEOUS
  Filled 2013-06-29 (×21): qty 1

## 2013-06-29 NOTE — ED Notes (Signed)
I have just given report to Jeremy Alvarez on 4th floor and will transport shortly.

## 2013-06-29 NOTE — ED Notes (Signed)
Staff member from nursing home states pt had unwitnessed fall this am.

## 2013-06-29 NOTE — ED Notes (Signed)
He is awake, alert and remains in no distress in the presence of his (private) sitter.  He receives lunch at this time also and is quite able to feed himself.

## 2013-06-29 NOTE — ED Provider Notes (Signed)
CSN: [id number]     Arrival date & time 06/29/13  1005 History   First MD Initiated Contact with Patient 06/29/13 1014     Chief Complaint  Patient presents with  . Fatigue  . Fall   (Consider location/radiation/quality/duration/timing/severity/associated sxs/prior Treatment) HPI Comments: 80 yo male who presents with his home health worker from his assisted living facility after an apparent fall.  His home health worker notes that she found him on the floor in his bathroom when she arrived this morning.  He had reportedly been in his room for less than five minutes, after having just finished breakfast.  Staff then found him somewhat hypotensive and had him transported to the ED.    Pt is unable to provide much significant history.  He states he fell, but cannot explain why.    There was apparently drops of blood around his toilet, which patient cannot explain.  He was seen in the ED for skin tears 2 days ago.    Patient is a 80 y.o. male presenting with fall.  Fall This is a new problem. The current episode started less than 1 hour ago. Episode frequency: once. The problem has been resolved. Pertinent negatives include no chest pain, no abdominal pain, no headaches and no shortness of breath. Nothing aggravates the symptoms. Nothing relieves the symptoms.    Past Medical History  Diagnosis Date  . Tobacco abuse   . Coronary artery disease   . MI (myocardial infarction)   . Dementia   . Diabetes mellitus without complication   . Hypertension   . Anxiety   . Depression   . Chronic airway obstruction, not elsewhere classified   . Unspecified hereditary and idiopathic peripheral neuropathy   . Spinal stenosis, unspecified region other than cervical   . Memory loss   . Type II or unspecified type diabetes mellitus with neurological manifestations, not stated as uncontrolled   . Other alteration of consciousness   . Vascular dementia with depressed mood   . Hypertrophy of prostate  without urinary obstruction and other lower urinary tract symptoms (LUTS)   . Dementia with behavioral disturbance    Past Surgical History  Procedure Laterality Date  . Coronary stent placement    . Cataract extraction      bilateral  . Laminectomy thoracic fusion posterior  2003  . Cardiac catheterization  2009   Family History  Problem Relation Age of Onset  . Cancer Son     Non Hodgkins, Lymphoma   History  Substance Use Topics  . Smoking status: Current Every Day Smoker -- 68 years    Types: Cigarettes  . Smokeless tobacco: Never Used  . Alcohol Use: No    Review of Systems  Constitutional: Negative for fever.  HENT: Negative for congestion.   Respiratory: Negative for cough and shortness of breath.   Cardiovascular: Negative for chest pain.  Gastrointestinal: Negative for nausea, vomiting, abdominal pain and diarrhea.  Neurological: Negative for headaches.  All other systems reviewed and are negative.    Allergies  Aspirin  Home Medications   Current Outpatient Rx  Name  Route  Sig  Dispense  Refill  . aspirin 81 MG chewable tablet   Oral   Chew 81 mg by mouth every morning.         Marland Kitchen atenolol (TENORMIN) 50 MG tablet   Oral   Take 50 mg by mouth every morning.          . carbamazepine (TEGRETOL) 200  MG tablet   Oral   Take 400-600 mg by mouth 2 (two) times daily. Takes 2 tablets in the morning and 3 tablets at bedtime         . divalproex (DEPAKOTE) 500 MG DR tablet   Oral   Take 750-1,000 mg by mouth 2 (two) times daily. Takes 750mg  in the morning and 1000mg  every night         . donepezil (ARICEPT) 10 MG tablet   Oral   Take 10 mg by mouth at bedtime.         . gabapentin (NEURONTIN) 300 MG capsule   Oral   Take 300-600 mg by mouth 2 (two) times daily. Takes 300mg  in the morning and 600mg  at bedtime         . lisinopril (PRINIVIL,ZESTRIL) 10 MG tablet   Oral   Take 10 mg by mouth every morning.         . medroxyPROGESTERone  (PROVERA) 10 MG tablet   Oral   Take 50 mg by mouth 2 (two) times daily.         . Memantine HCl ER (NAMENDA XR) 28 MG CP24   Oral   Take 28 mg by mouth daily.         . metFORMIN (GLUCOPHAGE) 1000 MG tablet   Oral   Take 1,000 mg by mouth 2 (two) times daily with a meal.         . PARoxetine (PAXIL) 40 MG tablet   Oral   Take 40 mg by mouth at bedtime.         . risperiDONE (RISPERDAL) 0.5 MG tablet   Oral   Take 0.5 mg by mouth 2 (two) times daily.         . sitaGLIPtin (JANUVIA) 100 MG tablet   Oral   Take 100 mg by mouth every morning.         . tamsulosin (FLOMAX) 0.4 MG CAPS capsule   Oral   Take 0.4 mg by mouth at bedtime.         Marland Kitchen acetaminophen (MAPAP) 500 MG tablet   Oral   Take 1,000 mg by mouth 3 (three) times daily as needed for pain.          BP 96/42  Pulse 62  Temp(Src) 97.6 F (36.4 C) (Oral)  Resp 15  SpO2 94% Physical Exam  Nursing note and vitals reviewed. Constitutional: He is oriented to person, place, and time. He appears well-developed and well-nourished. No distress.  HENT:  Head: Normocephalic and atraumatic.  Mouth/Throat: Oropharynx is clear and moist.  Eyes: Conjunctivae are normal. Pupils are equal, round, and reactive to light. No scleral icterus.  Neck: Neck supple.  Cardiovascular: Normal rate, regular rhythm, normal heart sounds and intact distal pulses.   No murmur heard. Pulmonary/Chest: Effort normal and breath sounds normal. No stridor. No respiratory distress. He has no wheezes. He has no rales.  Abdominal: Soft. He exhibits no distension. There is no tenderness. There is no rebound and no guarding.  Musculoskeletal: Normal range of motion. He exhibits edema (1+ BLE and BUE).  Neurological: He is alert and oriented to person, place, and time.  Skin: Skin is warm and dry. No rash noted.  Multiple healing skin tears on BUE.  None appear infected.   Psychiatric: He has a normal mood and affect. His behavior is  normal.    ED Course  Procedures (including critical care time) Labs Review Labs Reviewed  CBC  WITH DIFFERENTIAL - Abnormal; Notable for the following:    RBC 3.56 (*)    Hemoglobin 10.9 (*)    HCT 31.9 (*)    RDW 15.8 (*)    Lymphocytes Relative 11 (*)    Monocytes Relative 16 (*)    Monocytes Absolute 1.6 (*)    All other components within normal limits  COMPREHENSIVE METABOLIC PANEL - Abnormal; Notable for the following:    Sodium 131 (*)    Chloride 92 (*)    Glucose, Bld 123 (*)    Albumin 3.1 (*)    Total Bilirubin 0.2 (*)    GFR calc non Af Amer 81 (*)    All other components within normal limits  URINALYSIS, ROUTINE W REFLEX MICROSCOPIC - Abnormal; Notable for the following:    Color, Urine AMBER (*)    APPearance CLOUDY (*)    Nitrite POSITIVE (*)    Leukocytes, UA LARGE (*)    All other components within normal limits  PRO B NATRIURETIC PEPTIDE - Abnormal; Notable for the following:    Pro B Natriuretic peptide (BNP) 680.4 (*)    All other components within normal limits  URINE MICROSCOPIC-ADD ON - Abnormal; Notable for the following:    Bacteria, UA MANY (*)    All other components within normal limits  GLUCOSE, CAPILLARY - Abnormal; Notable for the following:    Glucose-Capillary 151 (*)    All other components within normal limits  URINE CULTURE  TROPONIN I  TROPONIN I  TROPONIN I   Imaging Review Ct Head Wo Contrast  06/29/2013   CLINICAL DATA:  Stroke  EXAM: CT HEAD WITHOUT CONTRAST  TECHNIQUE: Contiguous axial images were obtained from the base of the skull through the vertex without intravenous contrast.  COMPARISON:  04/01/2013  FINDINGS: Chronic ischemic changes. Atrophy. Trace fluid in the right mastoid air cells. Mucosal thickening in the left maxillary sinus. Nasal septum deviated to the left. No mass effect, midline shift, or acute intracranial hemorrhage.  IMPRESSION: No acute intracranial pathology.   Electronically Signed   By: Maryclare Bean M.D.    On: 06/29/2013 11:17   Dg Chest Port 1 View  06/29/2013   CLINICAL DATA:  Fall  EXAM: PORTABLE CHEST - 1 VIEW  COMPARISON:  05/04/2012  FINDINGS: Normal heart size. Low volumes. Bibasilar atelectasis versus airspace disease. No pneumothorax  IMPRESSION: Bibasilar atelectasis versus airspace disease.   Electronically Signed   By: Maryclare Bean M.D.   On: 06/29/2013 11:09  All radiology studies independently viewed by me.     EKG Interpretation    Date/Time:  Saturday June 29 2013 10:47:46 EST Ventricular Rate:  67 PR Interval:  231 QRS Duration: 146 QT Interval:  423 QTC Calculation: 446 R Axis:   -97 Text Interpretation:  Atrial fibrillation RBBB and LAFB compared to prior, a fib has replaced sinus rhythm. Confirmed by [facility]  MD, TREY ([id number]) on 06/29/2013 1:54:50 PM            MDM    1. Urinary tract infection   2.  Atrial fibrillation   80 yo male presenting from nursing facility after being found on floor of bathroom by his home health sitter.  History otherwise unclear as patient denied any complaint, but could not explain why he was on the floor.  He was nontoxic and well appearing.  Workup notable for new onset A fib and UTI.  Treated with ceftriaxone.  Discussed via telephone with Dr. Mayford Knife, who reviewed  EKGs.  Discussed with Dr. Rhona Leavenshiu who will admit.      Candyce ChurnJohn David Sayre Witherington, MD 06/29/13 [phone]

## 2013-06-29 NOTE — ED Notes (Signed)
He has just been examined by Dr. Rhona Leavenshiu.  He is loud and profane; at times furious at being admitted to the hospital.  He has picked at numerous small sores on his medial left forearm and has caused them to bleed.  I apply Xeroform/gauze conform gauze dressing to his left forearm at this time.

## 2013-06-29 NOTE — Progress Notes (Signed)
Discussed case with Dr. Mayford Knifeurner. Per Cardiology recs, obtain serial cardiac enzymes and 2D echo (already ordered) and if either study abnormal, would then formally consult Cardiology. Otherwise cont ASA for now.

## 2013-06-29 NOTE — ED Notes (Signed)
He had pulled out his IV just after IV ceftriaxone infused.  He remains awake, alert confused and in no distress.  He becomes agitated and profane at times; but only for short bursts.

## 2013-06-29 NOTE — ED Notes (Signed)
Collected CBC and CMP, unable to collect other labs.

## 2013-06-29 NOTE — Progress Notes (Signed)
ANTIBIOTIC CONSULT NOTE - INITIAL  Pharmacy Consult for Ceftriaxone Indication: UTI  Allergies  Allergen Reactions  . Aspirin Other (See Comments)    "can't take because it's a blood thinner"    Vital Signs: Temp: 98.1 F (36.7 C) (01/24 1400) Temp src: Oral (01/24 1400) BP: 96/42 mmHg (01/24 1016) Pulse Rate: 76 (01/24 1400) Intake/Output from previous day:   Intake/Output from this shift:    Labs:  Recent Labs  06/29/13 1045  WBC 10.0  HGB 10.9*  PLT 244  CREATININE 0.80   CrCl is unknown because there is no height on file for the current visit. No results found for this basename: VANCOTROUGH, VANCOPEAK, VANCORANDOM, GENTTROUGH, GENTPEAK, GENTRANDOM, TOBRATROUGH, TOBRAPEAK, TOBRARND, AMIKACINPEAK, AMIKACINTROU, AMIKACIN,  in the last 72 hours   Microbiology: No results found for this or any previous visit (from the past 720 hour(s)).  Medical History: Past Medical History  Diagnosis Date  . Tobacco abuse   . Coronary artery disease   . MI (myocardial infarction)   . Dementia   . Diabetes mellitus without complication   . Hypertension   . Anxiety   . Depression   . Chronic airway obstruction, not elsewhere classified   . Unspecified hereditary and idiopathic peripheral neuropathy   . Spinal stenosis, unspecified region other than cervical   . Memory loss   . Type II or unspecified type diabetes mellitus with neurological manifestations, not stated as uncontrolled   . Other alteration of consciousness   . Vascular dementia with depressed mood   . Hypertrophy of prostate without urinary obstruction and other lower urinary tract symptoms (LUTS)   . Dementia with behavioral disturbance     Medications:  Scheduled:  . aspirin EC  325 mg Oral Daily  . heparin  5,000 Units Subcutaneous Q8H  . sodium chloride  3 mL Intravenous Q12H   Infusions:  . sodium chloride    . sodium chloride     Assessment: 80 yo male presents from ALF after a fall. Empiric  antibiotics starting for possible UTI, culture pending.  Goal of Therapy:  Eradication of infection  Plan:   Ceftriaxone 1g IV q24h  Since this does not require any renal, hepatic or weight adjustments, Pharmacy with sign-off. Please re-consult if needed.  Loralee PacasErin Hula Tasso, PharmD, BCPS Pager: [phone] 06/29/2013,3:20 PM

## 2013-06-29 NOTE — ED Notes (Signed)
Bed: WA10 Expected date:  Expected time:  Means of arrival:  Comments: 

## 2013-06-29 NOTE — Progress Notes (Signed)
RN up on the floor notified wife of patient being in restraints. Wife let nurse know that patient was a DNR. RN let wife talk to second RN up on the floor, so there would be two witnesses. RN then notified on call and new orders put in for DNR.

## 2013-06-29 NOTE — H&P (Addendum)
[facility] History and Physical  Jeremy Alvarez ZOX:[id number] DOB: [birth year] DOA: 06/29/2013  Referring physician: Emergency Department PCP: Bufford Spikes, DO  Specialists:   Chief Complaint: Fall, new onset afib  HPI: Jeremy Alvarez is a 80 y.o. male  Who presents from assisted living facility. Pt has a hx of htn, dm, and reported hx of CAD. Pt was found down in unwitnessed fall. It is unclear if pt synopsized or not as pt is a poor historian. In the ED, the patient was noted to be in new onset afib, rate controlled. Head CT was unremarkable. The patient was noted to have UA suggestive of UTI and pt started on rocephin. Hospitalist consulted for admission.  Review of Systems:  Per above, the remainder of the 10pt ros reviewed and are neg  Past Medical History  Diagnosis Date  . Tobacco abuse   . Coronary artery disease   . MI (myocardial infarction)   . Dementia   . Diabetes mellitus without complication   . Hypertension   . Anxiety   . Depression   . Chronic airway obstruction, not elsewhere classified   . Unspecified hereditary and idiopathic peripheral neuropathy   . Spinal stenosis, unspecified region other than cervical   . Memory loss   . Type II or unspecified type diabetes mellitus with neurological manifestations, not stated as uncontrolled   . Other alteration of consciousness   . Vascular dementia with depressed mood   . Hypertrophy of prostate without urinary obstruction and other lower urinary tract symptoms (LUTS)   . Dementia with behavioral disturbance    Past Surgical History  Procedure Laterality Date  . Coronary stent placement    . Cataract extraction      bilateral  . Laminectomy thoracic fusion posterior  2003  . Cardiac catheterization  2009   Social History:  reports that he has been smoking Cigarettes.  He has been smoking about 0.00 packs per day for the past 68 years. He has never used smokeless tobacco. He reports that he does not drink  alcohol or use illicit drugs.  where does patient live--home, ALF, SNF? and with whom if at home?  Can patient participate in ADLs?  Allergies  Allergen Reactions  . Aspirin Other (See Comments)    "can't take because it's a blood thinner"    Family History  Problem Relation Age of Onset  . Cancer Son     Non Hodgkins, Lymphoma    (be sure to complete)  Prior to Admission medications   Medication Sig Start Date End Date Taking? Authorizing Provider  aspirin 81 MG chewable tablet Chew 81 mg by mouth every morning.   Yes Historical Provider, MD  atenolol (TENORMIN) 50 MG tablet Take 50 mg by mouth every morning.    Yes Historical Provider, MD  carbamazepine (TEGRETOL) 200 MG tablet Take 400-600 mg by mouth 2 (two) times daily. Takes 2 tablets in the morning and 3 tablets at bedtime   Yes Historical Provider, MD  divalproex (DEPAKOTE) 500 MG DR tablet Take 750-1,000 mg by mouth 2 (two) times daily. Takes 750mg  in the morning and 1000mg  every night   Yes Historical Provider, MD  donepezil (ARICEPT) 10 MG tablet Take 10 mg by mouth at bedtime.   Yes Historical Provider, MD  gabapentin (NEURONTIN) 300 MG capsule Take 300-600 mg by mouth 2 (two) times daily. Takes 300mg  in the morning and 600mg  at bedtime   Yes Historical Provider, MD  lisinopril (PRINIVIL,ZESTRIL) 10 MG tablet Take  10 mg by mouth every morning.   Yes Historical Provider, MD  medroxyPROGESTERone (PROVERA) 10 MG tablet Take 50 mg by mouth 2 (two) times daily.   Yes Historical Provider, MD  Memantine HCl ER (NAMENDA XR) 28 MG CP24 Take 28 mg by mouth daily.   Yes Historical Provider, MD  metFORMIN (GLUCOPHAGE) 1000 MG tablet Take 1,000 mg by mouth 2 (two) times daily with a meal.   Yes Historical Provider, MD  PARoxetine (PAXIL) 40 MG tablet Take 40 mg by mouth at bedtime.   Yes Historical Provider, MD  risperiDONE (RISPERDAL) 0.5 MG tablet Take 0.5 mg by mouth 2 (two) times daily.   Yes Historical Provider, MD  sitaGLIPtin  (JANUVIA) 100 MG tablet Take 100 mg by mouth every morning.   Yes Historical Provider, MD  tamsulosin (FLOMAX) 0.4 MG CAPS capsule Take 0.4 mg by mouth at bedtime.   Yes Historical Provider, MD  acetaminophen (MAPAP) 500 MG tablet Take 1,000 mg by mouth 3 (three) times daily as needed for pain.    Historical Provider, MD   Physical Exam: Filed Vitals:   06/29/13 1010 06/29/13 1016 06/29/13 1400  BP:  96/42   Pulse:  62 76  Temp:  97.6 F (36.4 C) 98.1 F (36.7 C)  TempSrc:  Oral Oral  Resp:  15 16  SpO2: 95% 94% 95%     General:  Awake, in nad  Eyes: PERRL B  ENT: Membranes moist, dentition fair  Neck: trachea midline, neck supple  Cardiovascular: regular, s1. s2  Respiratory: normal resp effort, no wheezing  Abdomen: soft, nondistended  Skin: normal skin turgor, multiple bleeding scabs over forearms  Musculoskeletal: perfused, no clubbing  Psychiatric: mood/affect normal//appears slightly confused  Neurologic: cn2-12 grossly intact, strength/sensation intact  Labs on Admission:  Basic Metabolic Panel:  Recent Labs Lab 06/29/13 1045  NA 131*  K 5.3  CL 92*  CO2 19  GLUCOSE 123*  BUN 17  CREATININE 0.80  CALCIUM 8.6   Liver Function Tests:  Recent Labs Lab 06/29/13 1045  AST 29  ALT 18  ALKPHOS 60  BILITOT 0.2*  PROT 6.2  ALBUMIN 3.1*   No results found for this basename: LIPASE, AMYLASE,  in the last 168 hours No results found for this basename: AMMONIA,  in the last 168 hours CBC:  Recent Labs Lab 06/29/13 1045  WBC 10.0  NEUTROABS 6.9  HGB 10.9*  HCT 31.9*  MCV 89.6  PLT 244   Cardiac Enzymes:  Recent Labs Lab 06/29/13 1045  TROPONINI <0.30    BNP (last 3 results)  Recent Labs  06/29/13 1036  PROBNP 680.4*   CBG: No results found for this basename: GLUCAP,  in the last 168 hours  Radiological Exams on Admission: Ct Head Wo Contrast  06/29/2013   CLINICAL DATA:  Stroke  EXAM: CT HEAD WITHOUT CONTRAST  TECHNIQUE:  Contiguous axial images were obtained from the base of the skull through the vertex without intravenous contrast.  COMPARISON:  04/01/2013  FINDINGS: Chronic ischemic changes. Atrophy. Trace fluid in the right mastoid air cells. Mucosal thickening in the left maxillary sinus. Nasal septum deviated to the left. No mass effect, midline shift, or acute intracranial hemorrhage.  IMPRESSION: No acute intracranial pathology.   Electronically Signed   By: Maryclare BeanArt  Hoss M.D.   On: 06/29/2013 11:17   Dg Chest Port 1 View  06/29/2013   CLINICAL DATA:  Fall  EXAM: PORTABLE CHEST - 1 VIEW  COMPARISON:  05/04/2012  FINDINGS: Normal heart size. Low volumes. Bibasilar atelectasis versus airspace disease. No pneumothorax  IMPRESSION: Bibasilar atelectasis versus airspace disease.   Electronically Signed   By: Maryclare Bean M.D.   On: 06/29/2013 11:09    EKG: Independently reviewed. Afib  Assessment/Plan Active Problems:   Dementia with behavioral disturbance   Fall   UTI (lower urinary tract infection)   Urinary tract infection   Diabetes mellitus   HTN (hypertension)   Atrial fibrillation   1. UTI 1. Afebrile. No leukocytosis 2. Urine cx pending 3. Rocephin empirically started in ED, will continue 4. Will admit to med-tele 2. New onset afib 1. Will follow serial cardiac enzymes 2. Will check 2D echo 3. Pt with hx of htn, dm, age over 85, hx of cad - equates to CHADS-VASc of 6 4. Given concerns of falls, would be hesitant to start full dose anticoagulant 5. Consider Cardiology consult to address anticoagulation 6. Cont on ASA for now 3. Falls 1. Will consult PT/OT for eval 4. DVT prophylaxis 1. Heparin subQ  Code Status: Full (must indicate code status--if unknown or must be presumed, indicate so) Family Communication: Pt in room (indicate person spoken with, if applicable, with phone number if by telephone) Disposition Plan: pending (indicate anticipated LOS)  Time spent:  CHIU, STEPHEN  K [facility] Pager [phone]  If 7PM-7AM, please contact night-coverage [email or web address] Password [facility] 06/29/2013, 3:34 PM

## 2013-06-29 NOTE — ED Notes (Signed)
[facility] Hx of dementia. Staff sent pt here for weakness that they noticed today. Pt normally wheelchair bound. EMS noted bright red blood around toilet at facility.

## 2013-06-30 DIAGNOSIS — I10 Essential (primary) hypertension: Secondary | ICD-10-CM

## 2013-06-30 DIAGNOSIS — I4891 Unspecified atrial fibrillation: Principal | ICD-10-CM

## 2013-06-30 DIAGNOSIS — E119 Type 2 diabetes mellitus without complications: Secondary | ICD-10-CM

## 2013-06-30 DIAGNOSIS — I517 Cardiomegaly: Secondary | ICD-10-CM

## 2013-06-30 LAB — GLUCOSE, CAPILLARY
GLUCOSE-CAPILLARY: 139 mg/dL — AB (ref 70–99)
GLUCOSE-CAPILLARY: 136 mg/dL — AB (ref 70–99)
Glucose-Capillary: 108 mg/dL — ABNORMAL HIGH (ref 70–99)
Glucose-Capillary: 137 mg/dL — ABNORMAL HIGH (ref 70–99)

## 2013-06-30 LAB — MRSA PCR SCREENING: MRSA by PCR: NEGATIVE

## 2013-06-30 LAB — COMPREHENSIVE METABOLIC PANEL
ALT: 15 U/L (ref 0–53)
AST: 18 U/L (ref 0–37)
Albumin: 2.6 g/dL — ABNORMAL LOW (ref 3.5–5.2)
Alkaline Phosphatase: 51 U/L (ref 39–117)
BUN: 13 mg/dL (ref 6–23)
CALCIUM: 8.1 mg/dL — AB (ref 8.4–10.5)
CO2: 25 meq/L (ref 19–32)
Chloride: 93 mEq/L — ABNORMAL LOW (ref 96–112)
Creatinine, Ser: 0.62 mg/dL (ref 0.50–1.35)
GFR, EST NON AFRICAN AMERICAN: 90 mL/min — AB (ref >90)
GLUCOSE: 115 mg/dL — AB (ref 70–99)
Potassium: 4.2 mEq/L (ref 3.7–5.3)
Sodium: 128 mEq/L — ABNORMAL LOW (ref 137–147)
Total Bilirubin: 0.3 mg/dL (ref 0.3–1.2)
Total Protein: 5.2 g/dL — ABNORMAL LOW (ref 6.0–8.3)

## 2013-06-30 LAB — CBC
HCT: 28.4 % — ABNORMAL LOW (ref 39.0–52.0)
Hemoglobin: 9.7 g/dL — ABNORMAL LOW (ref 13.0–17.0)
MCH: 30.5 pg (ref 26.0–34.0)
MCHC: 34.2 g/dL (ref 30.0–36.0)
MCV: 89.3 fL (ref 78.0–100.0)
Platelets: 242 10*3/uL (ref 150–400)
RBC: 3.18 MIL/uL — ABNORMAL LOW (ref 4.22–5.81)
RDW: 15.5 % (ref 11.5–15.5)
WBC: 14.2 10*3/uL — ABNORMAL HIGH (ref 4.0–10.5)

## 2013-06-30 LAB — TROPONIN I: Troponin I: <0.3 ng/mL (ref <0.30)

## 2013-06-30 MED ORDER — LORAZEPAM 2 MG/ML IJ SOLN
0.5000 mg | Freq: Once | INTRAMUSCULAR | Status: AC
  Administered 2013-06-30: 0.5 mg via INTRAVENOUS
  Filled 2013-06-30: qty 1

## 2013-06-30 MED ORDER — INFLUENZA VAC SPLIT QUAD 0.5 ML IM SUSP
0.5000 mL | INTRAMUSCULAR | Status: AC
  Administered 2013-07-01: 0.5 mL via INTRAMUSCULAR
  Filled 2013-06-30 (×2): qty 0.5

## 2013-06-30 NOTE — Progress Notes (Signed)
  Echocardiogram 2D Echocardiogram has been performed.  Arvil ChacoFoster, Parminder Cupples 06/30/2013, 3:08 PM

## 2013-06-30 NOTE — Progress Notes (Signed)
[facility] PROGRESS NOTE  Jeremy Alvarez WUJ:[id number] DOB: 10-Jun-1928 DOA: 06/29/2013 PCP: Bufford Spikes, DO  Assessment/Plan: 1. UTI  1. Afebrile. No leukocytosis 2. Urine cx pending 3. Rocephin empirically started in ED, will continue 4. Will admit to med-tele 2. New onset afib  1. Will follow serial cardiac enzymes 2. Will check 2D echo 3. Pt with hx of htn, dm, age over 35, hx of cad - equates to CHADS-VASc of 6 4. Given concerns of falls, would be hesitant to start full dose anticoagulant 5. Consider Cardiology consult to address anticoagulation 6. Cont on ASA for now 3. Falls  1. Will consult PT/OT for eval 4. DVT prophylaxis  1. Heparin subQ  Code Status: DNR Family Communication: Pt in room (indicate person spoken with, relationship, and if by phone, the number) Disposition Plan: Pending   Procedures:  2 D echo pending  Antibiotics:  Rocephin 06/29/13>>>  HPI/Subjective: Eager to go home. Overnight, was confused and pulling out lines.  Objective: Filed Vitals:   06/29/13 1606 06/29/13 2134 06/30/13 0417 06/30/13 1342  BP: 147/51 121/64 110/47 102/53  Pulse: 79 78 74 69  Temp: 97.8 F (36.6 C) 97.7 F (36.5 C) 98.6 F (37 C) 98.4 F (36.9 C)  TempSrc: Oral Oral Oral Oral  Resp: 16 16 18 19   Height: 5\' 11"  (1.803 m)     Weight: 87.8 kg (193 lb 9 oz)     SpO2: 97% 97% 93% 96%    Intake/Output Summary (Last 24 hours) at 06/30/13 1427 Last data filed at 06/30/13 1339  Gross per 24 hour  Intake 1833.75 ml  Output   1475 ml  Net 358.75 ml   Filed Weights   06/29/13 1606  Weight: 87.8 kg (193 lb 9 oz)    Exam:   General:  Awake, in nad  Cardiovascular: regular, s1, s2  Respiratory: normal resp effort, no wheezing  Abdomen: soft, nondistended  Musculoskeletal: perfused, no clubbing   Data Reviewed: Basic Metabolic Panel:  Recent Labs Lab 06/29/13 1045 06/30/13 0614  NA 131* 128*  K 5.3 4.2  CL 92* 93*  CO2 19 25  GLUCOSE  123* 115*  BUN 17 13  CREATININE 0.80 0.62  CALCIUM 8.6 8.1*   Liver Function Tests:  Recent Labs Lab 06/29/13 1045 06/30/13 0614  AST 29 18  ALT 18 15  ALKPHOS 60 51  BILITOT 0.2* 0.3  PROT 6.2 5.2*  ALBUMIN 3.1* 2.6*   No results found for this basename: LIPASE, AMYLASE,  in the last 168 hours No results found for this basename: AMMONIA,  in the last 168 hours CBC:  Recent Labs Lab 06/29/13 1045 06/30/13 0614  WBC 10.0 14.2*  NEUTROABS 6.9  --   HGB 10.9* 9.7*  HCT 31.9* 28.4*  MCV 89.6 89.3  PLT 244 242   Cardiac Enzymes:  Recent Labs Lab 06/29/13 1045 06/29/13 1808 06/30/13 0021 06/30/13 0614  TROPONINI <0.30 <0.30 <0.30 <0.30   BNP (last 3 results)  Recent Labs  06/29/13 1036  PROBNP 680.4*   CBG:  Recent Labs Lab 06/29/13 1630 06/29/13 2149 06/30/13 0749 06/30/13 1154  GLUCAP 151* 150* 108* 137*    Recent Results (from the past 240 hour(s))  URINE CULTURE     Status: None   Collection Time    06/29/13 12:10 PM      Result Value Range Status   Specimen Description URINE, CLEAN CATCH   Final   Special Requests NONE   Final   Culture  Setup Time     Final   Value: 06/29/2013 19:51     Performed at [facility] Count     Final   Value: >=100,000 COLONIES/ML     Performed at [facility]   Culture     Final   Value: ESCHERICHIA COLI     Performed at [facility]   Report Status PENDING   Incomplete  MRSA PCR SCREENING     Status: None   Collection Time    06/30/13 12:02 AM      Result Value Range Status   MRSA by PCR NEGATIVE  NEGATIVE Final   Comment:            The GeneXpert MRSA Assay (FDA     approved for NASAL specimens     only), is one component of a     comprehensive MRSA colonization     surveillance program. It is not     intended to diagnose MRSA     infection nor to guide or     monitor treatment for     MRSA infections.     Studies: Ct Head Wo Contrast  06/29/2013   CLINICAL  DATA:  Stroke  EXAM: CT HEAD WITHOUT CONTRAST  TECHNIQUE: Contiguous axial images were obtained from the base of the skull through the vertex without intravenous contrast.  COMPARISON:  04/01/2013  FINDINGS: Chronic ischemic changes. Atrophy. Trace fluid in the right mastoid air cells. Mucosal thickening in the left maxillary sinus. Nasal septum deviated to the left. No mass effect, midline shift, or acute intracranial hemorrhage.  IMPRESSION: No acute intracranial pathology.   Electronically Signed   By: Maryclare Bean M.D.   On: 06/29/2013 11:17   Dg Chest Port 1 View  06/29/2013   CLINICAL DATA:  Fall  EXAM: PORTABLE CHEST - 1 VIEW  COMPARISON:  05/04/2012  FINDINGS: Normal heart size. Low volumes. Bibasilar atelectasis versus airspace disease. No pneumothorax  IMPRESSION: Bibasilar atelectasis versus airspace disease.   Electronically Signed   By: Maryclare Bean M.D.   On: 06/29/2013 11:09    Scheduled Meds: . sodium chloride   Intravenous STAT  . aspirin EC  325 mg Oral Daily  . atenolol  50 mg Oral q morning - 10a  . carbamazepine  400 mg Oral Daily  . carbamazepine  600 mg Oral QHS  . cefTRIAXone (ROCEPHIN)  IV  1 g Intravenous Q24H  . divalproex  750 mg Oral Daily   And  . divalproex  1,000 mg Oral QHS  . donepezil  10 mg Oral QHS  . gabapentin  300 mg Oral Daily  . gabapentin  600 mg Oral QHS  . heparin  5,000 Units Subcutaneous Q8H  . [START ON 07/01/2013] influenza vac split quadrivalent PF  0.5 mL Intramuscular Tomorrow-1000  . insulin aspart  0-15 Units Subcutaneous TID WC  . insulin aspart  0-5 Units Subcutaneous QHS  . lisinopril  10 mg Oral q morning - 10a  . medroxyPROGESTERone  50 mg Oral BID  . Memantine HCl ER  28 mg Oral Daily  . PARoxetine  40 mg Oral QHS  . risperiDONE  0.5 mg Oral BID  . sodium chloride  3 mL Intravenous Q12H  . tamsulosin  0.4 mg Oral QHS   Continuous Infusions: . sodium chloride 75 mL/hr at 06/30/13 0865    Active Problems:   Dementia with  behavioral disturbance   Fall   UTI (lower  urinary tract infection)   Urinary tract infection   Diabetes mellitus   HTN (hypertension)   Atrial fibrillation  Time spent: 35min  Dvid Pendry K  [facility] Pager [phone]. If 7PM-7AM, please contact night-coverage at [email or web address], password [email or web address] 06/30/2013, 2:27 PM  LOS: 1 day

## 2013-06-30 NOTE — Progress Notes (Addendum)
Pt very agitated and restless on assessment. Yelling and cursing at staff, pulling on restraints. On call notified. Ativan ordered and given. Pt much more pleasant and cooperative with Ativan on board. Will continue to monitor. Aaryn Sermon, Lavone OrnSARA K, RN

## 2013-07-01 LAB — GLUCOSE, CAPILLARY
GLUCOSE-CAPILLARY: 141 mg/dL — AB (ref 70–99)
Glucose-Capillary: 108 mg/dL — ABNORMAL HIGH (ref 70–99)
Glucose-Capillary: 101 mg/dL — ABNORMAL HIGH (ref 70–99)
Glucose-Capillary: 163 mg/dL — ABNORMAL HIGH (ref 70–99)

## 2013-07-01 LAB — URINE CULTURE

## 2013-07-01 LAB — OCCULT BLOOD, POC DEVICE: Fecal Occult Bld: NEGATIVE

## 2013-07-01 MED ORDER — HALOPERIDOL LACTATE 5 MG/ML IJ SOLN
5.0000 mg | Freq: Four times a day (QID) | INTRAMUSCULAR | Status: DC | PRN
Start: 2013-07-01 — End: 2013-07-05
  Administered 2013-07-01 – 2013-07-02 (×4): 5 mg via INTRAVENOUS
  Filled 2013-07-01 (×4): qty 1

## 2013-07-01 NOTE — Progress Notes (Signed)
Clinical Social Work Department BRIEF PSYCHOSOCIAL ASSESSMENT 07/01/2013  Patient:  Riley ChurchesKOHUT,Dyllon     Account Number:  [id number]     Admit date:  06/29/2013  Clinical Social Worker:  Orpah GreekFOLEY,Ger Nicks, LCSWA  Date/Time:  07/01/2013 03:50 PM  Referred by:  Physician  Date Referred:  07/01/2013 Referred for  Other - See comment   Other Referral:   Admitted from: [facility]   Interview type:  Family Other interview type:   patient's wife, Nicholos JohnsKathleen via phone & Geraldine Contrasee, Resident Care Director from ALF    PSYCHOSOCIAL DATA Living Status:  FACILITY [facility] Level of care:  Assisted Living Primary support name:  Leata MouseKathleen Dikes (wife) c#: [phone] Primary support relationship to patient:  SPOUSE Degree of support available:   fair - wife and patient have been separated "for some time" and she has moved out of the state but patient is still supportive of patient & cares about him.    CURRENT CONCERNS Current Concerns  Post-Acute Placement   Other Concerns:    SOCIAL WORK ASSESSMENT / PLAN CSW [place] [facility] who states that they have been trying to find other placement for patient as wife states that she is unable to pay for him to stay at his current facility.   Assessment/plan status:  Information/[facility]erral to [facility] Other assessment/ plan:   Information/referral to community resources:   CSW completed FL2 and [facility] - [facility] to see if [facility] had [facility], she will be out tomorrow morning to assess patient.    PATIENT'S/FAMILY'S RESPONSE TO PLAN OF CARE: Patient's wife became tearful when talking about the stress of finding another facility for patient, as [place] [facility] and has health problems going on herself. Patient started chemo treatments on Friday and has a broken leg.       Lincoln MaxinKelly Marijke Guadiana, LCSW [facility] Clinical Social Worker cell #: [phone]

## 2013-07-01 NOTE — Progress Notes (Signed)
Restraints renewed, patient still very restless and agitated, pulling tubes, trying to climbed out of bed.

## 2013-07-01 NOTE — Progress Notes (Signed)
[facility] PROGRESS NOTE  Jeremy Alvarez ZOX:[id number] DOB: 04/01/1929 DOA: 06/29/2013 PCP: Bufford Spikes, DO  Assessment/Plan: 1. UTI  1. Afebrile. No leukocytosis 2. Urine cx with e.coli species 3. Rocephin empirically started in ED, will continue 4. Follow sensitivities 2. New onset afib  1. Serial cardiac enzymes neg x 4 2. 2D echo without unremarkable with EF 60-65% 3. Pt with hx of htn, dm, age over 71, hx of cad - equates to CHADS-VASc of 6 4. Given concerns of falls, would be hesitant to start full dose anticoagulant 5. Had discussed this with Cardiology 6. Cont on ASA for now 3. Falls  1. Will consult PT/OT for eval 4. DVT prophylaxis  1. Heparin subQ  Code Status: DNR Family Communication: Pt in room, updated POA yesterday by phone (indicate person spoken with, relationship, and if by phone, the number) Disposition Plan: Pending   Procedures:  2 D echo pending  Antibiotics:  Rocephin 06/29/13>>>  HPI/Subjective: Eager to go home. Remains confused and agitated overnight  Objective: Filed Vitals:   06/30/13 1342 06/30/13 2105 07/01/13 0300 07/01/13 0434  BP: 102/53 115/51  141/60  Pulse: 69 67  66  Temp: 98.4 F (36.9 C) 97.9 F (36.6 C)  98.1 F (36.7 C)  TempSrc: Oral Oral  Oral  Resp: 19 18  18   Height:      Weight:   88.814 kg (195 lb 12.8 oz)   SpO2: 96% 96%  98%    Intake/Output Summary (Last 24 hours) at 07/01/13 0903 Last data filed at 07/01/13 0434  Gross per 24 hour  Intake 2206.25 ml  Output   1100 ml  Net 1106.25 ml   Filed Weights   06/29/13 1606 07/01/13 0300  Weight: 87.8 kg (193 lb 9 oz) 88.814 kg (195 lb 12.8 oz)    Exam:   General:  Awake, in nad  Cardiovascular: regular, s1, s2  Respiratory: normal resp effort, no wheezing  Abdomen: soft, nondistended  Musculoskeletal: perfused, no clubbing   Data Reviewed: Basic Metabolic Panel:  Recent Labs Lab 06/29/13 1045 06/30/13 0614  NA 131* 128*  K 5.3 4.2   CL 92* 93*  CO2 19 25  GLUCOSE 123* 115*  BUN 17 13  CREATININE 0.80 0.62  CALCIUM 8.6 8.1*   Liver Function Tests:  Recent Labs Lab 06/29/13 1045 06/30/13 0614  AST 29 18  ALT 18 15  ALKPHOS 60 51  BILITOT 0.2* 0.3  PROT 6.2 5.2*  ALBUMIN 3.1* 2.6*   No results found for this basename: LIPASE, AMYLASE,  in the last 168 hours No results found for this basename: AMMONIA,  in the last 168 hours CBC:  Recent Labs Lab 06/29/13 1045 06/30/13 0614  WBC 10.0 14.2*  NEUTROABS 6.9  --   HGB 10.9* 9.7*  HCT 31.9* 28.4*  MCV 89.6 89.3  PLT 244 242   Cardiac Enzymes:  Recent Labs Lab 06/29/13 1045 06/29/13 1808 06/30/13 0021 06/30/13 0614  TROPONINI <0.30 <0.30 <0.30 <0.30   BNP (last 3 results)  Recent Labs  06/29/13 1036  PROBNP 680.4*   CBG:  Recent Labs Lab 06/30/13 0749 06/30/13 1154 06/30/13 1652 06/30/13 2234 07/01/13 0742  GLUCAP 108* 137* 139* 136* 108*    Recent Results (from the past 240 hour(s))  URINE CULTURE     Status: None   Collection Time    06/29/13 12:10 PM      Result Value Range Status   Specimen Description URINE, CLEAN CATCH   Final  Special Requests NONE   Final   Culture  Setup Time     Final   Value: 06/29/2013 19:51     Performed at [facility] Count     Final   Value: >=100,000 COLONIES/ML     Performed at [facility]   Culture     Final   Value: ESCHERICHIA COLI     Performed at [facility]   Report Status PENDING   Incomplete  MRSA PCR SCREENING     Status: None   Collection Time    06/30/13 12:02 AM      Result Value Range Status   MRSA by PCR NEGATIVE  NEGATIVE Final   Comment:            The GeneXpert MRSA Assay (FDA     approved for NASAL specimens     only), is one component of a     comprehensive MRSA colonization     surveillance program. It is not     intended to diagnose MRSA     infection nor to guide or     monitor treatment for     MRSA infections.      Studies: Ct Head Wo Contrast  06/29/2013   CLINICAL DATA:  Stroke  EXAM: CT HEAD WITHOUT CONTRAST  TECHNIQUE: Contiguous axial images were obtained from the base of the skull through the vertex without intravenous contrast.  COMPARISON:  04/01/2013  FINDINGS: Chronic ischemic changes. Atrophy. Trace fluid in the right mastoid air cells. Mucosal thickening in the left maxillary sinus. Nasal septum deviated to the left. No mass effect, midline shift, or acute intracranial hemorrhage.  IMPRESSION: No acute intracranial pathology.   Electronically Signed   By: Maryclare Bean M.D.   On: 06/29/2013 11:17   Dg Chest Port 1 View  06/29/2013   CLINICAL DATA:  Fall  EXAM: PORTABLE CHEST - 1 VIEW  COMPARISON:  05/04/2012  FINDINGS: Normal heart size. Low volumes. Bibasilar atelectasis versus airspace disease. No pneumothorax  IMPRESSION: Bibasilar atelectasis versus airspace disease.   Electronically Signed   By: Maryclare Bean M.D.   On: 06/29/2013 11:09    Scheduled Meds: . aspirin EC  325 mg Oral Daily  . atenolol  50 mg Oral q morning - 10a  . carbamazepine  400 mg Oral Daily  . carbamazepine  600 mg Oral QHS  . cefTRIAXone (ROCEPHIN)  IV  1 g Intravenous Q24H  . divalproex  750 mg Oral Daily   And  . divalproex  1,000 mg Oral QHS  . donepezil  10 mg Oral QHS  . gabapentin  300 mg Oral Daily  . gabapentin  600 mg Oral QHS  . heparin  5,000 Units Subcutaneous Q8H  . influenza vac split quadrivalent PF  0.5 mL Intramuscular Tomorrow-1000  . insulin aspart  0-15 Units Subcutaneous TID WC  . insulin aspart  0-5 Units Subcutaneous QHS  . lisinopril  10 mg Oral q morning - 10a  . medroxyPROGESTERone  50 mg Oral BID  . Memantine HCl ER  28 mg Oral Daily  . PARoxetine  40 mg Oral QHS  . risperiDONE  0.5 mg Oral BID  . sodium chloride  3 mL Intravenous Q12H  . tamsulosin  0.4 mg Oral QHS   Continuous Infusions: . sodium chloride 75 mL/hr at 06/30/13 2120    Active Problems:   Dementia with behavioral  disturbance   Fall   UTI (lower urinary  tract infection)   Urinary tract infection   Diabetes mellitus   HTN (hypertension)   Atrial fibrillation  Time spent: 35min  CHIU, STEPHEN K  [facility] Pager [phone]. If 7PM-7AM, please contact night-coverage at [email or web address], password [email or web address] 07/01/2013, 9:03 AM  LOS: 2 days

## 2013-07-01 NOTE — Progress Notes (Signed)
Clinical Social Work Department CLINICAL SOCIAL WORK PLACEMENT NOTE 07/01/2013  Patient:  Jeremy Alvarez,Jeremy Alvarez  Account Number:  [id number] Admit date:  06/29/2013  Clinical Social Worker:  Orpah GreekKELLY FOLEY, LCSWA  Date/time:  07/01/2013 03:59 PM  Clinical Social Work is seeking post-discharge placement for this patient at the following level of care:   ASSISTED LIVING/REST HOME   (*CSW will update this form in Epic as items are completed)   07/01/2013  Patient/family provided with [facility] of Clinical Social Work's list of facilities offering this level of care within the geographic area requested by the patient (or if unable, by the patient's family).  07/01/2013  Patient/family informed of their freedom to choose among providers that offer the needed level of care, that participate in Medicare, Medicaid or managed care program needed by the patient, have an available bed and are willing to accept the patient.  07/01/2013  Patient/family informed of [facility]' ownership interest in [facility], as well as of the fact that they are under no obligation to receive care at this facility.  PASARR submitted to EDS on  PASARR number received from EDS on   FL2 transmitted to all facilities in geographic area requested by pt/family on  07/01/2013 FL2 transmitted to all facilities within larger geographic area on   Patient informed that his/her managed care company has contracts with or will negotiate with  certain facilities, including the following:     Patient/family informed of bed offers received:   Patient chooses bed at  Physician recommends and patient chooses bed at    Patient to be transferred to  on   Patient to be transferred to facility by   The following physician request were entered in Epic:   Additional Comments:   Jeremy MaxinKelly Uyen Eichholz, LCSW [facility] Clinical Social Worker cell #: [phone]

## 2013-07-01 NOTE — Progress Notes (Signed)
Received patient with bilateral soft hand restraints on. Morning care done, restraints off while patient is eating breakfast feeding himself, still pulling tubes  And trying to climbed out of bed. Restraints continued at this time.

## 2013-07-02 DIAGNOSIS — W19XXXA Unspecified fall, initial encounter: Secondary | ICD-10-CM

## 2013-07-02 LAB — CBC
HEMATOCRIT: 29.9 % — AB (ref 39.0–52.0)
HEMOGLOBIN: 10.7 g/dL — AB (ref 13.0–17.0)
MCH: 31.6 pg (ref 26.0–34.0)
MCHC: 35.8 g/dL (ref 30.0–36.0)
MCV: 88.2 fL (ref 78.0–100.0)
Platelets: 266 10*3/uL (ref 150–400)
RBC: 3.39 MIL/uL — AB (ref 4.22–5.81)
RDW: 15.7 % — ABNORMAL HIGH (ref 11.5–15.5)
WBC: 11.4 10*3/uL — ABNORMAL HIGH (ref 4.0–10.5)

## 2013-07-02 LAB — GLUCOSE, CAPILLARY
GLUCOSE-CAPILLARY: 148 mg/dL — AB (ref 70–99)
Glucose-Capillary: 118 mg/dL — ABNORMAL HIGH (ref 70–99)
Glucose-Capillary: 126 mg/dL — ABNORMAL HIGH (ref 70–99)
Glucose-Capillary: 108 mg/dL — ABNORMAL HIGH (ref 70–99)

## 2013-07-02 LAB — BASIC METABOLIC PANEL
BUN: 12 mg/dL (ref 6–23)
CALCIUM: 8.1 mg/dL — AB (ref 8.4–10.5)
CO2: 24 meq/L (ref 19–32)
CREATININE: 0.54 mg/dL (ref 0.50–1.35)
Chloride: 93 mEq/L — ABNORMAL LOW (ref 96–112)
GFR calc Af Amer: >90 mL/min (ref >90)
GFR calc non Af Amer: >90 mL/min (ref >90)
GLUCOSE: 119 mg/dL — AB (ref 70–99)
Potassium: 4.5 mEq/L (ref 3.7–5.3)
Sodium: 128 mEq/L — ABNORMAL LOW (ref 137–147)

## 2013-07-02 MED ORDER — LISINOPRIL 5 MG PO TABS
5.0000 mg | ORAL_TABLET | Freq: Every morning | ORAL | Status: DC
Start: 2013-07-03 — End: 2013-07-05
  Administered 2013-07-03 – 2013-07-05 (×3): 5 mg via ORAL
  Filled 2013-07-02 (×3): qty 1

## 2013-07-02 NOTE — Evaluation (Addendum)
Physical Therapy Evaluation Patient Details Name: Jeremy Alvarez MRN: [id number] DOB: 1929-03-02 Today's Date: 07/02/2013 Time: 4098-1191 PT Time Calculation (min): 26 min  PT Assessment / Plan / Recommendation History of Present Illness  80 yo male admitted with fall, Afib, UTI, dementia with behavioral disturbance.   Clinical Impression  On eval, pt required Max-total assist of 2 for bed mobility and static sitting balance at EOB. Limited session due to pt lethargy. Pt was cooperative with respect to his behavior. However he did not follow commands or initiate tasks. Recommend SNF at discharge.    PT Assessment  Patient needs continued PT services (will follow on trial basis to assess pt's ability to participate)    Follow Up Recommendations  SNF;Supervision/Assistance - 24 hour (unless ALF can provide current level of care)    Does the patient have the potential to tolerate intense rehabilitation      Barriers to Discharge        Equipment Recommendations  None recommended by PT    Recommendations for Other Services     Frequency Min 2X/week    Precautions / Restrictions Precautions Precautions: Fall Restrictions Weight Bearing Restrictions: No   Pertinent Vitals/Pain No c/o or indication of pain      Mobility  Bed Mobility Overal bed mobility: Needs Assistance Bed Mobility: Supine to Sit;Sit to Supine Supine to sit: +2 for physical assistance;+2 for safety/equipment; Total assist Sit to supine: +2 for safety/equipment;+2 for physical assistance; Total assist General bed mobility comments: Assist for trunk and bil LEs. Pt did not follow commands or initiate any part of task. Utilized bedpad for scooting, positioning.  Transfers General transfer comment: unable to attempt pivot or standing    Exercises General Exercises - Lower Extremity Heel Slides: PROM;Both;10 reps;Supine   PT Diagnosis: Altered mental status;Generalized weakness  PT Problem List: Decreased  strength;Decreased activity tolerance;Decreased balance;Decreased mobility;Decreased cognition;Decreased safety awareness PT Treatment Interventions: Functional mobility training;Therapeutic activities;Therapeutic exercise;Patient/family education;Balance training     PT Goals(Current goals can be found in the care plan section) Acute Rehab PT Goals Patient Stated Goal: none stated PT Goal Formulation: Patient unable to participate in goal setting Time For Goal Achievement: 07/16/13 Potential to Achieve Goals: Fair  Visit Information  Last PT Received On: 07/02/13 Assistance Needed: +2 PT/OT/SLP Co-Evaluation/Treatment: Yes Reason for Co-Treatment: For patient/therapist safety;Necessary to address cognition/behavior during functional activity PT goals addressed during session: Mobility/safety with mobility;Balance;Strengthening/ROM History of Present Illness: 80 yo male admitted with fall, Afib, UTI, dementia with behavioral disturbance.        Prior Functioning  Home Living Family/patient expects to be discharged to:: Assisted living Additional Comments: pt unable to provide history/PLOF. No family present Prior Function Level of Independence: Needs assistance Communication Communication: No difficulties    Cognition  Cognition Arousal/Alertness: Lethargic Behavior During Therapy: Flat affect Overall Cognitive Status: History of cognitive impairments - at baseline    Extremity/Trunk Assessment Upper Extremity Assessment Upper Extremity Assessment: Defer to OT evaluation Lower Extremity Assessment Lower Extremity Assessment: RLE deficits/detail;LLE deficits/detail RLE Deficits / Details: unable to assess strength due to cognition, lethargy. ranged hip and knee to 90-90 in supine LLE Deficits / Details: unable to assess strength due to cognition, lethargy. ranged hip and knee to 90-90 in supine Cervical / Trunk Assessment Cervical / Trunk Assessment: Kyphotic   Balance  Balance Overall balance assessment: History of Falls;Needs assistance Sitting-balance support: Feet unsupported;No upper extremity supported Sitting balance-Leahy Scale: Zero Sitting balance - Comments: Sat EOB ~8 minutes. Pt required  max-total assist for sitting balance. Brief periods (~5 seconds) of pt sitting unsupported but with posterior lean.  Postural control: Posterior lean  End of Session PT - End of Session Activity Tolerance: Patient limited by lethargy Patient left: in bed;with call bell/phone within reach;with bed alarm set;with restraints reapplied  GP     Rebeca AlertJannie Lylee Corrow, MPT Pager: [phone]

## 2013-07-02 NOTE — Evaluation (Signed)
Occupational Therapy Evaluation and Discharge Patient Details Name: Jeremy Alvarez MRN: [id number] DOB: [birth year] Today's Date: 07/02/2013 Time: 3546-5681 OT Time Calculation (min): 24 min  OT Assessment / Plan / Recommendation History of present illness 80 yo male admitted with fall, Afib, UTI, dementia with behavioral disturbance.    Clinical Impression   Pt was admitted for the above.  Uncertain about pt's PLOF.  He does follow simple commands for basic adls.  Will not follow in acute care but would recommend trial of OT at snf, especially for self-feeding    OT Assessment  All further OT needs can be met in the next venue of care    Follow Up Recommendations  SNF    Barriers to Discharge      Equipment Recommendations  None recommended by OT    Recommendations for Other Services    Frequency       Precautions / Restrictions Precautions Precautions: Fall Restrictions Weight Bearing Restrictions: No   Pertinent Vitals/Pain No c/o, signs or symptoms of pain    ADL  Grooming: Wash/dry hands;Brushing hair (min A face; total A (10%) hair) Where Assessed - Grooming: Supine, head of bed up;Supported sitting Transfers/Ambulation Related to ADLs: A x 2, pt 0% to get to eob:  mostly max A for sitting balance.  +2 needed due to periods of agitation.  A x 2 to reposition on side at end of session:  pt with dressing at sacrum.   ADL Comments: Oriented to place.  Pt performed grooming both from hob raised and sitting eob with support.     OT Diagnosis: Generalized weakness  OT Problem List: Decreased strength;Decreased activity tolerance;Impaired balance (sitting and/or standing);Decreased cognition;Decreased safety awareness OT Treatment Interventions:     OT Goals(Current goals can be found in the care plan section) Acute Rehab OT Goals Patient Stated Goal: none stated  Visit Information  Last OT Received On: 07/02/13 Assistance Needed: +2 PT/OT/SLP Co-Evaluation/Treatment:  Yes Reason for Co-Treatment: For patient/therapist safety PT goals addressed during session: Mobility/safety with mobility;Balance;Strengthening/ROM OT goals addressed during session: ADL's and self-care History of Present Illness: 80 yo male admitted with fall, Afib, UTI, dementia with behavioral disturbance.        Prior [place] expects to be discharged to:: Assisted living Additional Comments: pt unable to provide history/PLOF. No family present Prior Function Level of Independence: Needs assistance Communication Communication:  (very little verbalization)         Vision/Perception     Cognition  Cognition Arousal/Alertness: Lethargic Behavior During Therapy: Flat affect Overall Cognitive Status: History of cognitive impairments - at baseline    Extremity/Trunk Assessment Upper Extremity Assessment Upper Extremity Assessment: Generalized weakness:  PROM to 90.  Pt used RUE to wash face/comb hair Lower Extremity Assessment Lower Extremity Assessment: RLE deficits/detail;LLE deficits/detail RLE Deficits / Details: unable to assess strength due to cognition, lethargy. ranged hip and knee to 90-90 in supine LLE Deficits / Details: unable to assess strength due to cognition, lethargy. ranged hip and knee to 90-90 in supine Cervical / Trunk Assessment Cervical / Trunk Assessment: Kyphotic     Mobility Bed Mobility Overal bed mobility: Needs Assistance Bed Mobility: Supine to Sit;Sit to Supine Supine to sit: +2 for physical assistance;+2 for safety/equipment;Max assist Sit to supine: +2 for safety/equipment;+2 for physical assistance;Max assist General bed mobility comments: Assist for trunk and bil LEs. Pt did not follow commands or initiate any part of task. Utilized bedpad for scooting, positioning.  Transfers General transfer comment: unable to attempt pivot or standing     Exercise    Balance Balance Overall balance  assessment: History of Falls;Needs assistance Sitting-balance support: Feet supported Sitting balance-Leahy Scale: Zero Sitting balance - Comments: Sat EOB ~8 minutes. Pt required total assist for sitting balance. Brief periods (~5 seconds) of pt sitting unsupported but with posterior lean.  Postural control: Posterior lean   End of Session OT - End of Session Activity Tolerance: Patient limited by fatigue Patient left: in bed;with call bell/phone within reach;with restraints reapplied;with bed alarm set  [place] 07/02/2013, 11:27 AM Lesle Chris, OTR/L [phone] 07/02/2013

## 2013-07-02 NOTE — Progress Notes (Signed)
Pt's BP is 100/48, HR 57. MD made aware of findings. No orders for now, but MD will decrease lisinopril for tomorrow's dose. Also pt was noted to be coughing and gurgling while he was being fed his lunch by the NT. RN was made aware of this and MD was made aware as well. MD will place order for a swallow evaluation to check for possible aspiration. Also pt to be moved to [facility] as tele beds are needed at this time. Report given and will transport pt and make family aware of move.   Arta BruceDeutsch, Jull Harral [facility] 07/02/2013 2:58 PM

## 2013-07-02 NOTE — Progress Notes (Signed)
[place] [facility] [place]  Pt calm and cooperative at this time.  NADN.  No change in patient assessment.   Will continue to monitor.

## 2013-07-02 NOTE — Progress Notes (Signed)
[facility] PROGRESS NOTE  Jeremy ChurchesRobert Alvarez WUJ:[id number] DOB: 11/28/1928 DOA: 06/29/2013 PCP: Jeremy SpikesEED, TIFFANY, DO  Assessment/Plan: 1. UTI  1. Afebrile. No leukocytosis 2. Urine cx with e.coli species, pan-sensitive 3. Rocephin empirically started in ED, will continue 2. New onset afib  1. Serial cardiac enzymes neg x 4 2. 2D echo without unremarkable with EF 60-65% 3. Pt with hx of htn, dm, age over 5075, hx of cad - equates to CHADS-VASc of 6 4. Given concerns of falls, would be hesitant to start full dose anticoagulant 5. Had discussed this with Cardiology 6. Cont on ASA for now 7. Family is in agreement with ASA for now. Would have patient follow up as an outpatient to determine if/when he would be a better anticoagulation candidate in the future 3. Falls  1. Will consult PT/OT for eval 4. DVT prophylaxis  1. Heparin subQ  Code Status: DNR Family Communication: Pt in room Disposition Plan: Pending   Procedures:  2 D echo pending  Antibiotics:  Rocephin 06/29/13>>>  HPI/Subjective: Very eager to go home. Was confused and agitated overnight  Objective: Filed Vitals:   07/01/13 1352 07/01/13 2104 07/02/13 0450 07/02/13 0456  BP: 115/54 160/74 123/53   Pulse: 61 99 68   Temp: 98 F (36.7 C) 98.2 F (36.8 C) 98.7 F (37.1 C)   TempSrc: Oral Oral Axillary   Resp: 18 20 18    Height:      Weight:    87.771 kg (193 lb 8 oz)  SpO2: 94% 98% 100%     Intake/Output Summary (Last 24 hours) at 07/02/13 0827 Last data filed at 07/02/13 0400  Gross per 24 hour  Intake 2887.5 ml  Output   1325 ml  Net 1562.5 ml   Filed Weights   06/29/13 1606 07/01/13 0300 07/02/13 0456  Weight: 87.8 kg (193 lb 9 oz) 88.814 kg (195 lb 12.8 oz) 87.771 kg (193 lb 8 oz)    Exam:   General:  Awake, in nad  Cardiovascular: regular, s1, s2  Respiratory: normal resp effort, no wheezing  Abdomen: soft, nondistended  Musculoskeletal: perfused, no clubbing   Data Reviewed: Basic  Metabolic Panel:  Recent Labs Lab 06/29/13 1045 06/30/13 0614  NA 131* 128*  Alvarez 5.3 4.2  CL 92* 93*  CO2 19 25  GLUCOSE 123* 115*  BUN 17 13  CREATININE 0.80 0.62  CALCIUM 8.6 8.1*   Liver Function Tests:  Recent Labs Lab 06/29/13 1045 06/30/13 0614  AST 29 18  ALT 18 15  ALKPHOS 60 51  BILITOT 0.2* 0.3  PROT 6.2 5.2*  ALBUMIN 3.1* 2.6*   No results found for this basename: LIPASE, AMYLASE,  in the last 168 hours No results found for this basename: AMMONIA,  in the last 168 hours CBC:  Recent Labs Lab 06/29/13 1045 06/30/13 0614  WBC 10.0 14.2*  NEUTROABS 6.9  --   HGB 10.9* 9.7*  HCT 31.9* 28.4*  MCV 89.6 89.3  PLT 244 242   Cardiac Enzymes:  Recent Labs Lab 06/29/13 1045 06/29/13 1808 06/30/13 0021 06/30/13 0614  TROPONINI <0.30 <0.30 <0.30 <0.30   BNP (last 3 results)  Recent Labs  06/29/13 1036  PROBNP 680.4*   CBG:  Recent Labs Lab 07/01/13 0742 07/01/13 1222 07/01/13 1656 07/01/13 2144 07/02/13 0723  GLUCAP 108* 141* 101* 163* 118*    Recent Results (from the past 240 hour(s))  URINE CULTURE     Status: None   Collection Time    06/29/13  12:10 PM      Result Value Range Status   Specimen Description URINE, CLEAN CATCH   Final   Special Requests NONE   Final   Culture  Setup Time     Final   Value: 06/29/2013 19:51     Performed at [facility] Count     Final   Value: >=100,000 COLONIES/ML     Performed at [facility]   Culture     Final   Value: ESCHERICHIA COLI     Performed at [facility]   Report Status 07/01/2013 FINAL   Final   Organism ID, Bacteria ESCHERICHIA COLI   Final  MRSA PCR SCREENING     Status: None   Collection Time    06/30/13 12:02 AM      Result Value Range Status   MRSA by PCR NEGATIVE  NEGATIVE Final   Comment:            The GeneXpert MRSA Assay (FDA     approved for NASAL specimens     only), is one component of a     comprehensive MRSA colonization      surveillance program. It is not     intended to diagnose MRSA     infection nor to guide or     monitor treatment for     MRSA infections.     Studies: No results found.  Scheduled Meds: . aspirin EC  325 mg Oral Daily  . atenolol  50 mg Oral q morning - 10a  . carbamazepine  400 mg Oral Daily  . carbamazepine  600 mg Oral QHS  . cefTRIAXone (ROCEPHIN)  IV  1 g Intravenous Q24H  . divalproex  750 mg Oral Daily   And  . divalproex  1,000 mg Oral QHS  . donepezil  10 mg Oral QHS  . gabapentin  300 mg Oral Daily  . gabapentin  600 mg Oral QHS  . heparin  5,000 Units Subcutaneous Q8H  . insulin aspart  0-15 Units Subcutaneous TID WC  . insulin aspart  0-5 Units Subcutaneous QHS  . lisinopril  10 mg Oral q morning - 10a  . medroxyPROGESTERone  50 mg Oral BID  . Memantine HCl ER  28 mg Oral Daily  . PARoxetine  40 mg Oral QHS  . risperiDONE  0.5 mg Oral BID  . sodium chloride  3 mL Intravenous Q12H  . tamsulosin  0.4 mg Oral QHS   Continuous Infusions: . sodium chloride 75 mL/hr at 07/02/13 0121    Active Problems:   Dementia with behavioral disturbance   Fall   UTI (lower urinary tract infection)   Urinary tract infection   Diabetes mellitus   HTN (hypertension)   Atrial fibrillation  Time spent:  Jeremy Alvarez  [facility] Pager [phone]. If 7PM-7AM, please contact night-coverage at [email or web address], password [facility] 07/02/2013, 8:27 AM  LOS: 3 days

## 2013-07-03 DIAGNOSIS — R4182 Altered mental status, unspecified: Secondary | ICD-10-CM | POA: Diagnosis present

## 2013-07-03 DIAGNOSIS — E871 Hypo-osmolality and hyponatremia: Secondary | ICD-10-CM

## 2013-07-03 LAB — COMPREHENSIVE METABOLIC PANEL
ALT: 27 U/L (ref 0–53)
AST: 29 U/L (ref 0–37)
Albumin: 2.7 g/dL — ABNORMAL LOW (ref 3.5–5.2)
Alkaline Phosphatase: 60 U/L (ref 39–117)
BUN: 12 mg/dL (ref 6–23)
CALCIUM: 8.5 mg/dL (ref 8.4–10.5)
CO2: 24 mEq/L (ref 19–32)
CREATININE: 0.53 mg/dL (ref 0.50–1.35)
Chloride: 92 mEq/L — ABNORMAL LOW (ref 96–112)
GFR calc non Af Amer: >90 mL/min (ref >90)
GLUCOSE: 153 mg/dL — AB (ref 70–99)
Potassium: 4.4 mEq/L (ref 3.7–5.3)
SODIUM: 128 meq/L — AB (ref 137–147)
Total Bilirubin: 0.2 mg/dL — ABNORMAL LOW (ref 0.3–1.2)
Total Protein: 5.8 g/dL — ABNORMAL LOW (ref 6.0–8.3)

## 2013-07-03 LAB — LIPID PANEL
Cholesterol: 130 mg/dL (ref 0–200)
HDL: 47 mg/dL (ref >39)
LDL Cholesterol: 69 mg/dL (ref 0–99)
Total CHOL/HDL Ratio: 2.8 RATIO
Triglycerides: 70 mg/dL (ref <150)
VLDL: 14 mg/dL (ref 0–40)

## 2013-07-03 LAB — CBC WITH DIFFERENTIAL/PLATELET
Basophils Absolute: 0 10*3/uL (ref 0.0–0.1)
Basophils Relative: 0 % (ref 0–1)
Eosinophils Absolute: 0.5 10*3/uL (ref 0.0–0.7)
Eosinophils Relative: 5 % (ref 0–5)
HCT: 30.9 % — ABNORMAL LOW (ref 39.0–52.0)
HEMOGLOBIN: 10.8 g/dL — AB (ref 13.0–17.0)
LYMPHS ABS: 1.1 10*3/uL (ref 0.7–4.0)
Lymphocytes Relative: 12 % (ref 12–46)
MCH: 31.1 pg (ref 26.0–34.0)
MCHC: 35 g/dL (ref 30.0–36.0)
MCV: 89 fL (ref 78.0–100.0)
MONO ABS: 1.5 10*3/uL — AB (ref 0.1–1.0)
MONOS PCT: 16 % — AB (ref 3–12)
NEUTROS ABS: 6.1 10*3/uL (ref 1.7–7.7)
NEUTROS PCT: 66 % (ref 43–77)
Platelets: 305 10*3/uL (ref 150–400)
RBC: 3.47 MIL/uL — ABNORMAL LOW (ref 4.22–5.81)
RDW: 15.6 % — ABNORMAL HIGH (ref 11.5–15.5)
WBC: 9.2 10*3/uL (ref 4.0–10.5)

## 2013-07-03 LAB — MAGNESIUM: Magnesium: 1.7 mg/dL (ref 1.5–2.5)

## 2013-07-03 LAB — HEMOGLOBIN A1C
Hgb A1c MFr Bld: 6.5 % — ABNORMAL HIGH (ref <5.7)
MEAN PLASMA GLUCOSE: 140 mg/dL — AB (ref <117)

## 2013-07-03 LAB — GLUCOSE, CAPILLARY
GLUCOSE-CAPILLARY: 110 mg/dL — AB (ref 70–99)
GLUCOSE-CAPILLARY: 159 mg/dL — AB (ref 70–99)
GLUCOSE-CAPILLARY: 132 mg/dL — AB (ref 70–99)
GLUCOSE-CAPILLARY: 105 mg/dL — AB (ref 70–99)

## 2013-07-03 MED ORDER — CEFIXIME 400 MG PO TABS
400.0000 mg | ORAL_TABLET | Freq: Every day | ORAL | Status: DC
  Administered 2013-07-03 – 2013-07-05 (×3): 400 mg via ORAL
  Filled 2013-07-03 (×3): qty 1

## 2013-07-03 MED ORDER — SODIUM CHLORIDE 0.9 % IV SOLN
INTRAVENOUS | Status: DC
  Administered 2013-07-03 – 2013-07-05 (×5): via INTRAVENOUS

## 2013-07-03 NOTE — Progress Notes (Signed)
Report called to Asher MuirJamie, RN on [facility].

## 2013-07-03 NOTE — Discharge Summary (Addendum)
Physician Discharge Summary  Jeremy Alvarez ZOX:[id number] DOB: 1928/06/09 DOA: 06/29/2013  PCP: Bufford Spikes, DO  Admit date: 06/29/2013 Discharge date: 07/03/2013  Time spent: 40 minutes  Recommendations for Outpatient Follow-up:  UTI   -Afebrile. No leukocytosis -Urine cx with e.coli species, pan-sensitive -DC Rocephin start on cefixime by mouth  Mental status change -Most likely combination of dementia with acute effects of UTI -Per assisted living facility patient was ambulatory prior to being admitted.  New onset afib  -Serial cardiac enzymes neg x 4 -2D echo without unremarkable with EF 60-65% -Pt with hx of htn, dm, age over 15, hx of cad - equates to CHADS-VASc of 6 -Given concerns of falls, would be hesitant to start full dose anticoagulant -Discussed this with Cardiology, and family and both agreed to Cont on ASA for now - Would have patient follow up as an outpatient to determine if/when he would be a better anticoagulation candidate in the future  Falls  -Recommend continued PT at patient's assisted living facility [facility])  Hyponatremia -Will start patient on normal saline 144ml/hr, most likely today to some continued dehydration. Should correct by tomorrow  Dementia with behavioral disturbance -Continue on home medication Aricept,  Diabetes type 2 -Continue SSI until patient discharged -Obtain hemoglobin A1c -Obtain lipid panel   Discharge Diagnoses:  Active Problems:   Dementia with behavioral disturbance   Fall   UTI (lower urinary tract infection)   Urinary tract infection   Diabetes mellitus   HTN (hypertension)   Atrial fibrillation   Hyponatremia   Discharge Condition: Stable  Diet recommendation: Regular Filed Weights   07/01/13 0300 07/02/13 0456 07/03/13 0551  Weight: 88.814 kg (195 lb 12.8 oz) 87.771 kg (193 lb 8 oz) 88.043 kg (194 lb 1.6 oz)    History of present illness:  80 yo WM PMHx dementia with behavioral disturbance, diabetes,  HTN, falls, Who presents from assisted living facility. Pt was found down in unwitnessed fall. It is unclear if pt synopsized or not as pt is a poor historian. In the ED, the patient was noted to be in new onset afib, rate controlled. Head CT was unremarkable. The patient was noted to have UA suggestive of UTI and pt started on rocephin. 1/28 patient confused, requesting to be discharged. Patient unsure of where he resides. Investigation through patient's records determines that Ms. Kabe Mckoy [phone] (wife) is his HPOA, however does not reside with patient. Her RN Marcelle Smiling [facility] [phone] wife wishes that patient be placed in a nursing home. Currently he did have been sent out, awaiting reply.     Procedures: Echocardiogram 06/30/2013 - Left ventricle: The cavity size was normal. Systolic function was normal.  -LVEF= 60% to 65%. - (grade 1 diastolic dysfunction). - Mitral valve: Moderately calcified annulus. - Left atrium: The atrium was mildly dilated. - Right atrium: The atrium was mildly dilated.  CT head without contrast 06/29/2013  Chronic ischemic changes. Atrophy. Trace fluid in the right mastoid  air cells. Mucosal thickening in the left maxillary sinus. Nasal  septum deviated to the left. No mass effect, midline shift, or acute  intracranial hemorrhage.  Consultations:   Antibiotics Ceftriaxone>> 1/25-stopped 1/28 Cefixime 1/28>>    Discharge Exam: Filed Vitals:   07/02/13 1419 07/02/13 1500 07/02/13 2130 07/03/13 0551  BP: 100/48 124/59 140/66 131/61  Pulse: 57 65 65 62  Temp: 97.5 F (36.4 C) 97.6 F (36.4 C) 97.9 F (36.6 C) 98.7 F (37.1 C)  TempSrc: Oral Oral Oral Oral  Resp: 16 16 18 18   Height:      Weight:    88.043 kg (194 lb 1.6 oz)  SpO2: 96% 100% 96% 95%    General: A./O. x1, NAD, cooperative, very confused Cardiovascular: Regular rate, negative murmurs rubs or gallops, DP/PT pulse one plus bilateral Respiratory: Clear to auscultation  bilateral Musculoskeletal: One plus pitting edema bilateral  Discharge Instructions     Medication List    ASK your doctor about these medications       aspirin 81 MG chewable tablet  Chew 81 mg by mouth every morning.     atenolol 50 MG tablet  Commonly known as:  TENORMIN  Take 50 mg by mouth every morning.     carbamazepine 200 MG tablet  Commonly known as:  TEGRETOL  Take 400-600 mg by mouth 2 (two) times daily. Takes 2 tablets in the morning and 3 tablets at bedtime     divalproex 500 MG DR tablet  Commonly known as:  DEPAKOTE  Take 750-1,000 mg by mouth 2 (two) times daily. Takes 750mg  in the morning and 1000mg  every night     donepezil 10 MG tablet  Commonly known as:  ARICEPT  Take 10 mg by mouth at bedtime.     gabapentin 300 MG capsule  Commonly known as:  NEURONTIN  Take 300-600 mg by mouth 2 (two) times daily. Takes 300mg  in the morning and 600mg  at bedtime     lisinopril 10 MG tablet  Commonly known as:  PRINIVIL,ZESTRIL  Take 10 mg by mouth every morning.     MAPAP 500 MG tablet  Generic drug:  acetaminophen  Take 1,000 mg by mouth 3 (three) times daily as needed for pain.     medroxyPROGESTERone 10 MG tablet  Commonly known as:  PROVERA  Take 50 mg by mouth 2 (two) times daily.     metFORMIN 1000 MG tablet  Commonly known as:  GLUCOPHAGE  Take 1,000 mg by mouth 2 (two) times daily with a meal.     NAMENDA XR 28 MG Cp24  Generic drug:  Memantine HCl ER  Take 28 mg by mouth daily.     PARoxetine 40 MG tablet  Commonly known as:  PAXIL  Take 40 mg by mouth at bedtime.     risperiDONE 0.5 MG tablet  Commonly known as:  RISPERDAL  Take 0.5 mg by mouth 2 (two) times daily.     sitaGLIPtin 100 MG tablet  Commonly known as:  JANUVIA  Take 100 mg by mouth every morning.     tamsulosin 0.4 MG Caps capsule  Commonly known as:  FLOMAX  Take 0.4 mg by mouth at bedtime.       Allergies  Allergen Reactions  . Aspirin Other (See Comments)     "can't take because it's a blood thinner"      The results of significant diagnostics from this hospitalization (including imaging, microbiology, ancillary and laboratory) are listed below for reference.    Significant Diagnostic Studies: Ct Head Wo Contrast  06/29/2013   CLINICAL DATA:  Stroke  EXAM: CT HEAD WITHOUT CONTRAST  TECHNIQUE: Contiguous axial images were obtained from the base of the skull through the vertex without intravenous contrast.  COMPARISON:  04/01/2013  FINDINGS: Chronic ischemic changes. Atrophy. Trace fluid in the right mastoid air cells. Mucosal thickening in the left maxillary sinus. Nasal septum deviated to the left. No mass effect, midline shift, or acute intracranial hemorrhage.  IMPRESSION: No acute intracranial pathology.  Electronically Signed   By: Maryclare Bean M.D.   On: 06/29/2013 11:17   Dg Chest Port 1 View  06/29/2013   CLINICAL DATA:  Fall  EXAM: PORTABLE CHEST - 1 VIEW  COMPARISON:  05/04/2012  FINDINGS: Normal heart size. Low volumes. Bibasilar atelectasis versus airspace disease. No pneumothorax  IMPRESSION: Bibasilar atelectasis versus airspace disease.   Electronically Signed   By: Maryclare Bean M.D.   On: 06/29/2013 11:09    Microbiology: Recent Results (from the past 240 hour(s))  URINE CULTURE     Status: None   Collection Time    06/29/13 12:10 PM      Result Value Range Status   Specimen Description URINE, CLEAN CATCH   Final   Special Requests NONE   Final   Culture  Setup Time     Final   Value: 06/29/2013 19:51     Performed at [facility] Count     Final   Value: >=100,000 COLONIES/ML     Performed at [facility]   Culture     Final   Value: ESCHERICHIA COLI     Performed at [facility]   Report Status 07/01/2013 FINAL   Final   Organism ID, Bacteria ESCHERICHIA COLI   Final  MRSA PCR SCREENING     Status: None   Collection Time    06/30/13 12:02 AM      Result Value Range Status   MRSA by PCR  NEGATIVE  NEGATIVE Final   Comment:            The GeneXpert MRSA Assay (FDA     approved for NASAL specimens     only), is one component of a     comprehensive MRSA colonization     surveillance program. It is not     intended to diagnose MRSA     infection nor to guide or     monitor treatment for     MRSA infections.     Labs: Basic Metabolic Panel:  Recent Labs Lab 06/29/13 1045 06/30/13 0614 07/02/13 0800 07/03/13 0905  NA 131* 128* 128* 128*  K 5.3 4.2 4.5 4.4  CL 92* 93* 93* 92*  CO2 19 25 24 24   GLUCOSE 123* 115* 119* 153*  BUN 17 13 12 12   CREATININE 0.80 0.62 0.54 0.53  CALCIUM 8.6 8.1* 8.1* 8.5  MG  --   --   --  1.7   Liver Function Tests:  Recent Labs Lab 06/29/13 1045 06/30/13 0614 07/03/13 0905  AST 29 18 29   ALT 18 15 27   ALKPHOS 60 51 60  BILITOT 0.2* 0.3 0.2*  PROT 6.2 5.2* 5.8*  ALBUMIN 3.1* 2.6* 2.7*   No results found for this basename: LIPASE, AMYLASE,  in the last 168 hours No results found for this basename: AMMONIA,  in the last 168 hours CBC:  Recent Labs Lab 06/29/13 1045 06/30/13 0614 07/02/13 0800 07/03/13 0905  WBC 10.0 14.2* 11.4* 9.2  NEUTROABS 6.9  --   --  6.1  HGB 10.9* 9.7* 10.7* 10.8*  HCT 31.9* 28.4* 29.9* 30.9*  MCV 89.6 89.3 88.2 89.0  PLT 244 242 266 305   Cardiac Enzymes:  Recent Labs Lab 06/29/13 1045 06/29/13 1808 06/30/13 0021 06/30/13 0614  TROPONINI <0.30 <0.30 <0.30 <0.30   BNP: BNP (last 3 results)  Recent Labs  06/29/13 1036  PROBNP 680.4*   CBG:  Recent Labs Lab 07/02/13 0723 07/02/13  1145 07/02/13 1615 07/02/13 2140 07/03/13 0737  GLUCAP 118* 126* 148* 108* 110*       Signed:  Carolyne Littlesurtis Woods, MD [facility] [phone] pager

## 2013-07-03 NOTE — Progress Notes (Signed)
CSW spoke with patient's wife who states that patient had a sitter @ [facility] prior to hospitalization, but if no longer able to pay for a sitter therefore, [name]. CSW spoke with [name] who has offered a bed & will submit to patient's insurance, [name] for authorization. CSW also provided patient's wife with Assisted Living Facility/Memory Care Unit list for once patient has completed rehab stay.   Clinical Social Work Department CLINICAL SOCIAL WORK PLACEMENT NOTE 07/03/2013  Patient:  Jeremy Alvarez,Jeremy Alvarez  Account Number:  [id number] Admit date:  06/29/2013  Clinical Social Worker:  Orpah GreekKELLY FOLEY, LCSWA  Date/time:  07/01/2013 03:59 PM  Clinical Social Work is seeking post-discharge placement for this patient at the following level of care:   ASSISTED LIVING/REST HOME   (*CSW will update this form in Epic as items are completed)   07/01/2013  [facility]family provided with [name] list of facilities offering this level of care within the geographic area requested by the patient (or if unable, by the patient's family).  07/01/2013  Patient/family informed of their freedom to choose among providers that offer the needed level of care, that participate in Medicare, Medicaid or managed care program needed by the patient, have an available bed and are willing to accept the patient.  07/01/2013  Patient/family informed of [facility]' ownership interest in [facility], as well as of the fact that they are under no obligation to receive care at this facility.  PASARR submitted to EDS on 07/03/2013 PASARR number received from EDS on 07/03/2013  FL2 transmitted to all facilities in geographic area requested by pt/family on  07/01/2013 FL2 transmitted to all facilities within larger geographic area on   Patient informed that his/her managed care company has contracts with or will  negotiate with  certain facilities, including the following:   [name]     Patient/family informed of bed offers received:  07/03/2013 [name] Physician recommends and patient chooses bed at    [name]   Patient to be transferred to facility by   The following physician request were entered in Epic:   Additional Comments:   [name], [name] Clinical Social Worker cell #: [phone]

## 2013-07-03 NOTE — Progress Notes (Signed)
Pt arrived to room 1301 via bed from [facility].  Vital signs are stable. Pt has dressings to BUE. Pt is alert but not oriented. Will continue to monitor.

## 2013-07-03 NOTE — Evaluation (Signed)
Clinical/Bedside Swallow Evaluation Patient Details  Name: Jeremy Alvarez MRN: [id number] Date of Birth: [birth year]  Today's Date: 07/03/2013 Time: 1130-1155 SLP Time Calculation (min): 25 min  Past Medical History:  Past Medical History  Diagnosis Date  . Tobacco abuse   . Coronary artery disease   . MI (myocardial infarction)   . Dementia   . Diabetes mellitus without complication   . Hypertension   . Anxiety   . Depression   . Chronic airway obstruction, not elsewhere classified   . Unspecified hereditary and idiopathic peripheral neuropathy   . Spinal stenosis, unspecified region other than cervical   . Memory loss   . Type II or unspecified type diabetes mellitus with neurological manifestations, not stated as uncontrolled   . Other alteration of consciousness   . Vascular dementia with depressed mood   . Hypertrophy of prostate without urinary obstruction and other lower urinary tract symptoms (LUTS)   . Dementia with behavioral disturbance    Past Surgical History:  Past Surgical History  Procedure Laterality Date  . Coronary stent placement    . Cataract extraction      bilateral  . Laminectomy thoracic fusion posterior  2003  . Cardiac catheterization  2009   HPI:  80 yo male with h/o dementia residing at [facility]F adm with mental status changes.  Found to have abnormality in sodium levels.  Per SLP phone conversation with ALF med tech, pt was eating fine and would ambulate throughout facility with a walker. Pt is separated from his wife but she is his decision maker per ALF med tech.  Swallow evaluation ordered due to pt coughing with intake yesterday.     Assessment / Plan / Recommendation Clinical Impression  Pt without focal cn deficits and found with empty breakfast tray at bedside (nurse tech reports he fed himself).  His cognitive deficits and mental status - lethargy, ? impact of medications - are source of aspiration risk.  Initially pt was alert during  interview, but he became sleepy - Rn reports he received many medications before SLP visit.  MIld oral pocketing/stasis with cracker - clearing with cued dry swallow.  Overt cough with liquids via straw- suspect premature spill into pharynx/larynx.    Rec continue regular diet with strict precautions - feeding pt only when fully alert/cooperative.  SLP to follow up x1 for dysphagia management given CXR findings and RN documentation of overt coughing with intake yesterday.      Aspiration Risk  Mild    Diet Recommendation Regular;Thin liquid   Medication Administration: Whole meds with puree Supervision: Patient able to self feed (when not agitated and alert) Compensations: Slow rate;Small sips/bites;Check for pocketing Postural Changes and/or Swallow Maneuvers: Seated upright 90 degrees;Upright 30-60 min after meal    Other  Recommendations Oral Care Recommendations: Oral care before and after PO (oral care after meal)   Follow Up Recommendations  None    Frequency and Duration min 1 x/week  1 week   Pertinent Vitals/Pain Afebrile, decreased    SLP Swallow Goals     Swallow Study Prior Functional Status   Pt resides at [facility] and ate well prior to admission per [employer].     General Date of Onset: 07/03/13 HPI: 80 yo male with h/o dementia residing at [facility] ALF adm with mental status changes.  Found to have abnormality in sodium levels.  Per SLP phone conversation with ALF med tech, pt was eating fine and would ambulate throughout facility with  a walker. Pt is separated from his wife but she is his decision maker per ALF med tech.  Swallow evaluation ordered due to pt coughing with intake yesterday.   Type of Study: Bedside swallow evaluation Diet Prior to this Study: Regular;Thin liquids Temperature Spikes Noted: No Respiratory Status: Room air History of Recent Intubation: No Behavior/Cognition: Alert;Cooperative;Pleasant mood Oral Cavity - Dentition: Adequate  natural dentition Self-Feeding Abilities:  (fed self breakfast per nurse tech, but not pt is sleepy - assist to feed) Patient Positioning: Upright in bed Baseline Vocal Quality: Clear Volitional Cough: Cognitively unable to elicit Volitional Swallow: Unable to elicit    Oral/Motor/Sensory Function Overall Oral Motor/Sensory Function: Appears within functional limits for tasks assessed (generalized weakness but no focal cn deficits)   Ice Chips Ice chips: Not tested   Thin Liquid Thin Liquid: Impaired Presentation: Straw;Cup Pharyngeal  Phase Impairments: Cough - Immediate Other Comments: cough x1 immediately after swallow from straw, likely d/t pt's mental status/lethargy:  further boluses tolerated well    Nectar Thick Nectar Thick Liquid: Not tested   Honey Thick Honey Thick Liquid: Not tested   Puree Puree: Within functional limits Presentation: Spoon   Solid   GO    Solid: Impaired Oral Phase Impairments: Impaired mastication;Reduced lingual movement/coordination;Impaired anterior to posterior transit Oral Phase Functional Implications: Oral residue Other Comments: cleared with cue to swallow!        Donavan Burnetamara Bassel Gaskill, MS [facility] SLP [phone]

## 2013-07-03 NOTE — Progress Notes (Signed)
CARE MANAGEMENT NOTE 07/03/2013  Patient:  Jeremy Alvarez,Jeremy Alvarez   Account Number:  [id number]  Date Initiated:  07/03/2013  Documentation initiated by:  Iria Jamerson  Subjective/Objective Assessment:   first day with this patient, wife has given permission to seek snf placement due to patient increasing weakness and confusion.     Action/Plan:   [facility] with a private sitter now for snf placement.   Anticipated DC Date:  07/06/2013   Anticipated DC Plan:  SKILLED NURSING FACILITY  In-house referral  Clinical Social Worker      DC Planning Services  NA      [facility]  NA   Choice offered to / List presented to:  NA   DME arranged  NA      DME agency  NA     HH arranged  NA      HH agency  NA   Status of service:  In process, will continue to follow Medicare Important Message given?  NA - LOS <3 / Initial given by admissions (If response is "NO", the following Medicare IM given date fields will be blank) Date Medicare IM given:   Date Additional Medicare IM given:    Discharge Disposition:    Per UR Regulation:  Reviewed for med. necessity/level of care/duration of stay  If discussed at Long Length of Stay Meetings, dates discussed:    Comments:  [id number]/Keanen Dohse Stark JockDavis, RN, BSN, [place] [phone] Chart Reviewed for discharge and hospital needs. Discharge needs at time of review:  None present will follow for needs. Review of patient progress due on [phone].

## 2013-07-04 LAB — GLUCOSE, CAPILLARY
GLUCOSE-CAPILLARY: 108 mg/dL — AB (ref 70–99)
Glucose-Capillary: 122 mg/dL — ABNORMAL HIGH (ref 70–99)
Glucose-Capillary: 123 mg/dL — ABNORMAL HIGH (ref 70–99)
Glucose-Capillary: 145 mg/dL — ABNORMAL HIGH (ref 70–99)

## 2013-07-04 MED ORDER — LISINOPRIL 5 MG PO TABS: 5.0000 mg | ORAL_TABLET | Freq: Every morning | ORAL | Status: AC

## 2013-07-04 NOTE — Progress Notes (Signed)
CS[facility] re: status of insurance authorization.  Per [facility], pt insurance authorization has been assigned to RN with pt insurance and pending authorization.   CSW to continue to await notification [facility] that insurance authorization has been received in order for CSW to facilitate pt discharge needs to SNF.  CSW to continue to follow.  Loletta SpecterSuzanna Kidd, MSW, LCSW Clinical Social Work [phone]

## 2013-07-04 NOTE — Progress Notes (Signed)
CSW continuing to follow for SNF placement to [facility].   CSW [facility] to [facility] regarding [facility] process. Per Rokell at [name], insurance authorization has been submitted and has been assigned to a nurse with pt insurance and awaiting insurance authorization.   CSW to continue to follow and assist with pt discharge planning needs to [facility] when pt medically stable for discharge and insurance authorization received.  Jeremy SpecterSuzanna Letanya Alvarez, MSW, LCSW Clinical Social Work [phone]

## 2013-07-04 NOTE — Progress Notes (Signed)
CSW received notification from [facility] that facility spoke with pt insurance, [name] and per liaison at [name] pt insurance states that insurance authorization should be given to the facility tomorrow morning 07/05/2013.  CSW to follow up with [facility] tomorrow to ensure that insurance authorization received.  CSW notified MD and RN.  Pt family notified.  CSW to facilitate pt [facility] needs [facility] when insurance authorization received as pt has no other safe d/c plan at this time.   Loletta SpecterSuzanna Kidd, MSW, LCSW Clinical Social Work [phone]

## 2013-07-04 NOTE — Progress Notes (Signed)
Speech Language Pathology Treatment: Dysphagia  Patient Details Name: Jeremy Alvarez MRN: [id number] DOB: [birth year] Today's Date: 07/04/2013 Time: 6701-1003 SLP Time Calculation (min): 9 min  Assessment / Plan / Recommendation Clinical Impression  Pt seen for follow up regarding dysphagia management.  Per discussion with nurse technician who reports pt ate 100% of his meal without difficulties.    Pt sleepy but participative today and consumed water, graham cracker and applesauce without s/s of aspiration.   Minimal oral stasis of solids apparent clearing with bite of applesauce.  Rec continue regular/thin diet with general aspiration precautions.  SLP to sign off as all goals met.    HPI HPI: 80 yo male with h/o dementia residing at [place] with mental status changes.  Found to have abnormality in sodium levels.  Per SLP phone conversation with ALF med tech, pt was eating fine and would ambulate throughout facility with a walker. Pt is separated from his wife but she is his decision maker per ALF med tech.  Swallow evaluation completed yesterday and today pt seen to assess tolerance.     Pertinent Vitals Afebrile, decreased  SLP Plan  All goals met    Recommendations Diet recommendations: Regular;Thin liquid Liquids provided via: Straw Medication Administration: Whole meds with puree Supervision: Patient able to self feed (when alert and not agitated) Compensations: Slow rate;Small sips/bites;Check for pocketing Postural Changes and/or Swallow Maneuvers: Seated upright 90 degrees;Upright 30-60 min after meal              Oral Care Recommendations: Oral care before and after PO (oral care after meal) Follow up Recommendations: None Plan: All goals met    [place], [facility] SLP [phone]

## 2013-07-04 NOTE — Progress Notes (Signed)
Physical Therapy Treatment Patient Details Name: Jeremy ChurchesRobert Alvarez MRN: [id number] DOB: 01/23/1929 Today's Date: 07/04/2013 Time: 1100-1140 PT Time Calculation (min): 40 min  PT Assessment / Plan / Recommendation  History of Present Illness 80 yo male admitted with fall, Afib, UTI, dementia with behavioral disturbance.    PT Comments   On arrival pt was sleeping with B mittens on.  Slow to arouse pt was able to tell me his name and a little about his history.  Assisted Pt to EOB total assist.  Once upright pt demon severe R lean.  Pt required Mod Assist to stay upright 10 min.  Assisted with standing + 2 total assist and amb limited distance + 3 assist using EVA walker.  Positioned in recliner with HOYER in chair.   Follow Up Recommendations  SNF     Does the patient have the potential to tolerate intense rehabilitation     Barriers to Discharge        Equipment Recommendations  None recommended by PT    Recommendations for Other Services    Frequency Min 2X/week   Progress towards PT Goals Progress towards PT goals: Progressing toward goals  Plan      Precautions / Restrictions Precautions Precautions: Fall Restrictions Weight Bearing Restrictions: No    Pertinent Vitals/Pain No c/o pain    Mobility  Bed Mobility Overal bed mobility: Needs Assistance Bed Mobility: Supine to Sit Supine to sit: +2 for physical assistance;+2 for safety/equipment (pt 10%) General bed mobility comments: Increased, increased time to process instructions then increased, increased time to oerform task.  Assist to rise upper body total assist.  Once upright pt required Mod assist as pt demon severe R lean with inabilty to self correct.  Transfers Overall transfer level: Needs assistance Equipment used:  (EVA walker) Transfers: Sit to/from Stand General transfer comment: + 2 total assist pt 25% with severe R lean using EVA walker for increased support pt was able to partially stand upright with 100%  VC's and tactile cueing.  Ambulation/Gait Ambulation/Gait assistance: Total assist;+2 physical assistance;+2 safety/equipment (Pt 30%) Ambulation Distance (Feet): 8 Feet Assistive device:  (EVA walker) Gait Pattern/deviations: Step-to pattern;Decreased stance time - left;Decreased step length - right;Decreased step length - left;Shuffle;Trunk flexed;Antalgic Gait velocity: decreased General Gait Details: + 2 total assist with EVA walker pt was able to amb 8 feet with 3rd assist foloowling with recliner.  Severe R lean/slow/sluggish.     PT Goals (current goals can now be found in the care plan section)    Visit Information  Last PT Received On: 07/04/13 Assistance Needed: +2 History of Present Illness: 80 yo male admitted with fall, Afib, UTI, dementia with behavioral disturbance.     Subjective Data      Cognition       Balance     End of Session PT - End of Session Equipment Utilized During Treatment: Gait belt Activity Tolerance: Patient limited by fatigue;Treatment limited secondary to medical complications (Comment) (dementia) Patient left: in chair;with call bell/phone within reach Nurse Communication: Need for lift equipment   Felecia ShellingLori Etheline Geppert  PTA [facility]  Acute  Rehab Pager      [phone]

## 2013-07-05 LAB — GLUCOSE, CAPILLARY
Glucose-Capillary: 105 mg/dL — ABNORMAL HIGH (ref 70–99)
Glucose-Capillary: 153 mg/dL — ABNORMAL HIGH (ref 70–99)

## 2013-07-05 NOTE — Discharge Summary (Signed)
Physician Discharge Summary  Jeremy Alvarez ZOX:[id number] DOB: 1928-06-27 DOA: 06/29/2013  PCP: Bufford Spikes, DO  Admit date: 06/29/2013 Discharge date: 07/05/2013  Time spent: 40 minutes  Recommendations for Outpatient Follow-up:  UTI   -Afebrile. No leukocytosis -Urine cx with e.coli species, pan-sensitive -DC Rocephin start on cefixime by mouth  Mental status change -Most likely combination of dementia with acute effects of UTI -Per assisted living facility patient was ambulatory prior to being admitted. -Improved today, but still pleasantly confused  New onset afib  -Serial cardiac enzymes neg x 4 -2D echo without unremarkable with EF 60-65% -Pt with hx of htn, dm, age over 19, hx of cad - equates to CHADS-VASc of 6 -Given concerns of falls, would be hesitant to start full dose anticoagulant -Discussed this with Cardiology, and family and both agreed to Cont on ASA for now - Would have patient follow up as an outpatient to determine if/when he would be a better anticoagulation candidate in the future  Falls  -Recommend continued PT at patient's assisted living facility [facility])  Hyponatremia -Patient eating and drinking normally Should correct. -SNF to monitor  Dementia with behavioral disturbance -Continue on home medication Aricept,  Diabetes type 2 -Continue SSI until patient discharged -hemoglobin A1c 1/28= 6.5 -lipid panel within ADA guidelines   HTN -Continue atenolol and lisinopril   Discharge Diagnoses:  Principal Problem:   Mental status change Active Problems:   Dementia with behavioral disturbance   Fall   UTI (lower urinary tract infection)   Urinary tract infection   Diabetes mellitus   HTN (hypertension)   Atrial fibrillation   Hyponatremia   Discharge Condition: Stable  Diet recommendation: Regular Filed Weights   07/03/13 1624 07/04/13 0610 07/05/13 0645  Weight: 87.272 kg (192 lb 6.4 oz) 87.3 kg (192 lb 7.4 oz) 87.045 kg (191 lb 14.4  oz)    History of present illness:  80 yo WM PMHx dementia with behavioral disturbance, diabetes, HTN, falls, Who presents from assisted living facility. Pt was found down in unwitnessed fall. It is unclear if pt synopsized or not as pt is a poor historian. In the ED, the patient was noted to be in new onset afib, rate controlled. Head CT was unremarkable. The patient was noted to have UA suggestive of UTI and pt started on rocephin. 1/28 patient confused, requesting to be discharged. Patient unsure of where he resides. Investigation through patient's records determines that Ms. [name] [phone] (wife) is his HPOA, however does not reside with patient. Her RN Marcelle Smiling [facility] [phone] wife wishes that patient be placed in a nursing home. Currently he did have been sent out, awaiting reply. 1/30 patient pleasantly confused request to go home     Procedures: Echocardiogram 06/30/2013 - Left ventricle: The cavity size was normal. Systolic function was normal.  -LVEF= 60% to 65%. - (grade 1 diastolic dysfunction). - Mitral valve: Moderately calcified annulus. - Left atrium: The atrium was mildly dilated. - Right atrium: The atrium was mildly dilated.  CT head without contrast 06/29/2013  Chronic ischemic changes. Atrophy. Trace fluid in the right mastoid  air cells. Mucosal thickening in the left maxillary sinus. Nasal  septum deviated to the left. No mass effect, midline shift, or acute  intracranial hemorrhage.  Consultations:   Antibiotics Ceftriaxone>> 1/25-stopped 1/28 Cefixime 1/28>>    Discharge Exam: Filed Vitals:   07/04/13 0610 07/04/13 1412 07/04/13 2100 07/05/13 0645  BP: 132/62 117/57 145/75 159/68  Pulse: 69 69 68 70  Temp:  98 F (36.7 C) 98.7 F (37.1 C) 98.3 F (36.8 C) 98.3 F (36.8 C)  TempSrc: Oral Oral Oral Oral  Resp: 18 16 20 20   Height:      Weight: 87.3 kg (192 lb 7.4 oz)   87.045 kg (191 lb 14.4 oz)  SpO2: 98% 94% 98% 96%    General:  A./O. x2 (does not know where/f when), NAD, cooperative, very confused Cardiovascular: Regular rate, negative murmurs rubs or gallops, DP/PT pulse one plus bilateral Respiratory: Clear to auscultation bilateral Musculoskeletal: One plus pitting edema bilateral  Discharge Instructions     Medication List         aspirin 81 MG chewable tablet  Chew 81 mg by mouth every morning.     atenolol 50 MG tablet  Commonly known as:  TENORMIN  Take 50 mg by mouth every morning.     carbamazepine 200 MG tablet  Commonly known as:  TEGRETOL  Take 400-600 mg by mouth 2 (two) times daily. Takes 2 tablets in the morning and 3 tablets at bedtime     divalproex 500 MG DR tablet  Commonly known as:  DEPAKOTE  Take 750-1,000 mg by mouth 2 (two) times daily. Takes 750mg  in the morning and 1000mg  every night     donepezil 10 MG tablet  Commonly known as:  ARICEPT  Take 10 mg by mouth at bedtime.     gabapentin 300 MG capsule  Commonly known as:  NEURONTIN  Take 300-600 mg by mouth 2 (two) times daily. Takes 300mg  in the morning and 600mg  at bedtime     lisinopril 5 MG tablet  Commonly known as:  PRINIVIL,ZESTRIL  Take 1 tablet (5 mg total) by mouth every morning.     MAPAP 500 MG tablet  Generic drug:  acetaminophen  Take 1,000 mg by mouth 3 (three) times daily as needed for pain.     medroxyPROGESTERone 10 MG tablet  Commonly known as:  PROVERA  Take 50 mg by mouth 2 (two) times daily.     metFORMIN 1000 MG tablet  Commonly known as:  GLUCOPHAGE  Take 1,000 mg by mouth 2 (two) times daily with a meal.     NAMENDA XR 28 MG Cp24  Generic drug:  Memantine HCl ER  Take 28 mg by mouth daily.     PARoxetine 40 MG tablet  Commonly known as:  PAXIL  Take 40 mg by mouth at bedtime.     risperiDONE 0.5 MG tablet  Commonly known as:  RISPERDAL  Take 0.5 mg by mouth 2 (two) times daily.     sitaGLIPtin 100 MG tablet  Commonly known as:  JANUVIA  Take 100 mg by mouth every morning.      tamsulosin 0.4 MG Caps capsule  Commonly known as:  FLOMAX  Take 0.4 mg by mouth at bedtime.       Allergies  Allergen Reactions  . Aspirin Other (See Comments)    "can't take because it's a blood thinner"      The results of significant diagnostics from this hospitalization (including imaging, microbiology, ancillary and laboratory) are listed below for reference.    Significant Diagnostic Studies: Ct Head Wo Contrast  06/29/2013   CLINICAL DATA:  Stroke  EXAM: CT HEAD WITHOUT CONTRAST  TECHNIQUE: Contiguous axial images were obtained from the base of the skull through the vertex without intravenous contrast.  COMPARISON:  04/01/2013  FINDINGS: Chronic ischemic changes. Atrophy. Trace fluid in the right mastoid air  cells. Mucosal thickening in the left maxillary sinus. Nasal septum deviated to the left. No mass effect, midline shift, or acute intracranial hemorrhage.  IMPRESSION: No acute intracranial pathology.   Electronically Signed   By: Maryclare Bean M.D.   On: 06/29/2013 11:17   Dg Chest Port 1 View  06/29/2013   CLINICAL DATA:  Fall  EXAM: PORTABLE CHEST - 1 VIEW  COMPARISON:  05/04/2012  FINDINGS: Normal heart size. Low volumes. Bibasilar atelectasis versus airspace disease. No pneumothorax  IMPRESSION: Bibasilar atelectasis versus airspace disease.   Electronically Signed   By: Maryclare Bean M.D.   On: 06/29/2013 11:09    Microbiology: Recent Results (from the past 240 hour(s))  URINE CULTURE     Status: None   Collection Time    06/29/13 12:10 PM      Result Value Range Status   Specimen Description URINE, CLEAN CATCH   Final   Special Requests NONE   Final   Culture  Setup Time     Final   Value: 06/29/2013 19:51     Performed at [facility] Count     Final   Value: >=100,000 COLONIES/ML     Performed at [facility]   Culture     Final   Value: ESCHERICHIA COLI     Performed at [facility]   Report Status 07/01/2013 FINAL   Final    Organism ID, Bacteria ESCHERICHIA COLI   Final  MRSA PCR SCREENING     Status: None   Collection Time    06/30/13 12:02 AM      Result Value Range Status   MRSA by PCR NEGATIVE  NEGATIVE Final   Comment:            The GeneXpert MRSA Assay (FDA     approved for NASAL specimens     only), is one component of a     comprehensive MRSA colonization     surveillance program. It is not     intended to diagnose MRSA     infection nor to guide or     monitor treatment for     MRSA infections.     Labs: Basic Metabolic Panel:  Recent Labs Lab 06/29/13 1045 06/30/13 0614 07/02/13 0800 07/03/13 0905  NA 131* 128* 128* 128*  K 5.3 4.2 4.5 4.4  CL 92* 93* 93* 92*  CO2 19 25 24 24   GLUCOSE 123* 115* 119* 153*  BUN 17 13 12 12   CREATININE 0.80 0.62 0.54 0.53  CALCIUM 8.6 8.1* 8.1* 8.5  MG  --   --   --  1.7   Liver Function Tests:  Recent Labs Lab 06/29/13 1045 06/30/13 0614 07/03/13 0905  AST 29 18 29   ALT 18 15 27   ALKPHOS 60 51 60  BILITOT 0.2* 0.3 0.2*  PROT 6.2 5.2* 5.8*  ALBUMIN 3.1* 2.6* 2.7*   No results found for this basename: LIPASE, AMYLASE,  in the last 168 hours No results found for this basename: AMMONIA,  in the last 168 hours CBC:  Recent Labs Lab 06/29/13 1045 06/30/13 0614 07/02/13 0800 07/03/13 0905  WBC 10.0 14.2* 11.4* 9.2  NEUTROABS 6.9  --   --  6.1  HGB 10.9* 9.7* 10.7* 10.8*  HCT 31.9* 28.4* 29.9* 30.9*  MCV 89.6 89.3 88.2 89.0  PLT 244 242 266 305   Cardiac Enzymes:  Recent Labs Lab 06/29/13 1045 06/29/13 1808 06/30/13 0021 06/30/13 7564  TROPONINI <0.30 <0.30 <0.30 <0.30   BNP: BNP (last 3 results)  Recent Labs  06/29/13 1036  PROBNP 680.4*   CBG:  Recent Labs Lab 07/04/13 0758 07/04/13 1147 07/04/13 1653 07/04/13 2057 07/05/13 0731  GLUCAP 122* 123* 145* 108* 105*       Signed:  Carolyne Littlesurtis Gennie Eisinger, MD [facility] [phone] pager

## 2013-07-05 NOTE — Progress Notes (Signed)
[facility]. Patient's son, Molly MaduroRobert aware. [place]. [facility] scheduled for 1:30p pickup (Service Request Id: [id number]).   Clinical Social Work Department CLINICAL SOCIAL WORK PLACEMENT NOTE 07/05/2013  Patient:  Jeremy ChurchesKOHUT,Fabrizio  Account Number:  [id number] Admit date:  06/29/2013  Clinical Social Worker:  Orpah GreekKELLY FOLEY, LCSWA  Date/time:  07/01/2013 03:59 PM  Clinical Social Work is seeking post-discharge placement for this patient at the following level of care:   ASSISTED LIVING/REST HOME   (*CSW will update this form in Epic as items are completed)   07/01/2013  Patient/family provided with [facility] Department of Clinical Social Work's list of facilities offering this level of care within the geographic area requested by the patient (or if unable, by the patient's family).  07/01/2013  Patient/family informed of their freedom to choose among providers that offer the needed level of care, that participate in Medicare, Medicaid or managed care program needed by the patient, have an available bed and are willing to accept the patient.  07/01/2013  Patient/family informed of [facility]' ownership interest in [facility], as well as of the fact that they are under no obligation to receive care at this facility.  PASARR submitted to [employer] on 07/03/2013 PASARR number received from [employer] on 07/03/2013  FL2 transmitted to all facilities in geographic area requested by pt/family on  07/01/2013 FL2 transmitted to all facilities within larger geographic area on   Patient informed that his/her managed care company has contracts with or will negotiate with  certain facilities, including the following:   [facility]     Patient/family informed of bed offers received:  07/03/2013 [facility] Physician recommends and patient chooses bed at    [facility]  07/05/2013 Patient to be transferred to facility by PTAR  The following physician request were entered in Epic:   Additional Comments:   Lincoln MaxinKelly Patte Winkel, LCSW [facility] Clinical Social Worker cell #: [phone]

## 2013-07-12 ENCOUNTER — Telehealth: Payer: Self-pay | Admitting: *Deleted

## 2013-07-12 NOTE — Telephone Encounter (Signed)
Spoke with patient's wife concerning some papers from [employer], she states that she was unaware of needing this paperwork any longer. She agreed that they can be shredded and if needed again they will be sent to us at a later date.

## 2013-10-04 DEATH — deceased

## 2014-11-13 IMAGING — CT CT HEAD W/O CM
2 series · 17 of 30 positions shown, 20 images · non-contrast
Comparison: 04/01/2013

CLINICAL DATA: Stroke

EXAM:
CT HEAD WITHOUT CONTRAST
TECHNIQUE: Contiguous axial images were obtained from the base of the skull
through the vertex without intravenous contrast.

[Series 2: head w/o · axial · non-contrast · 0.48mm/px · z∈[-168,-48]mm · 9 of 32 slices shown, 12 images]
[im 4/32  brain]
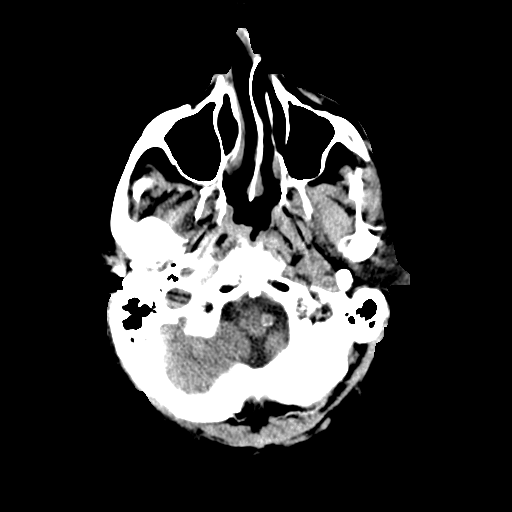
[im 4/32  bone]
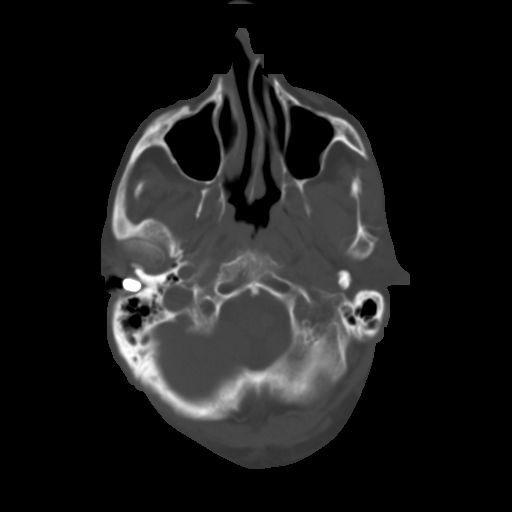
[im 7/32  brain]
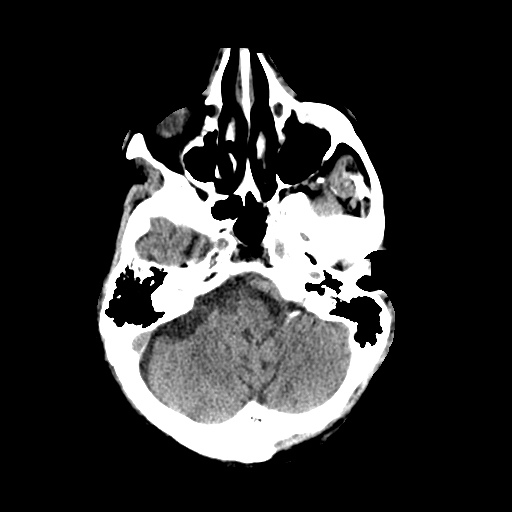
[im 10/32  brain]
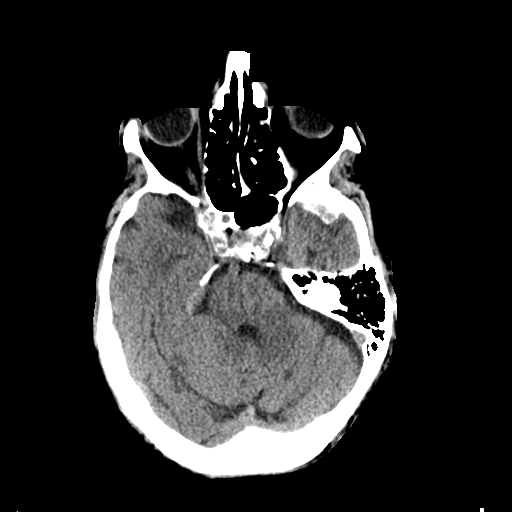
[im 13/32  brain]
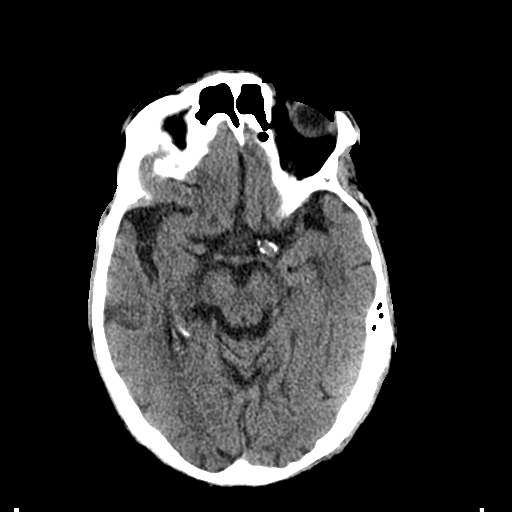
[im 16/32  brain]
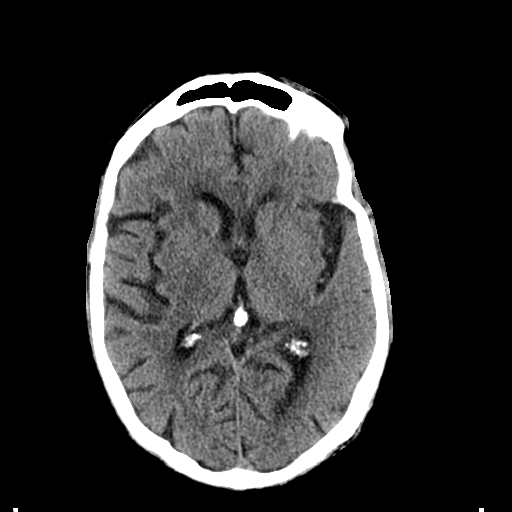
[im 16/32  bone]
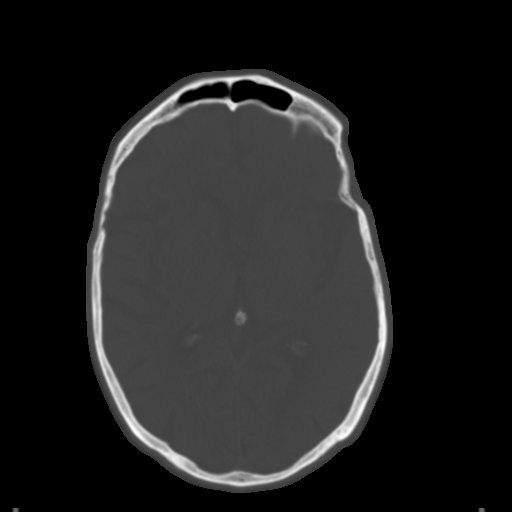
[im 19/32  brain]
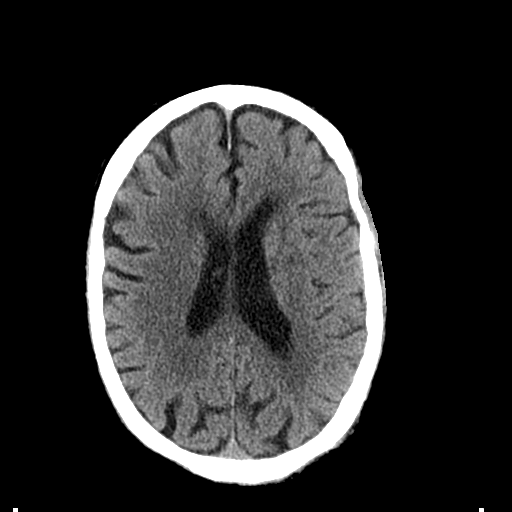
[im 22/32  brain]
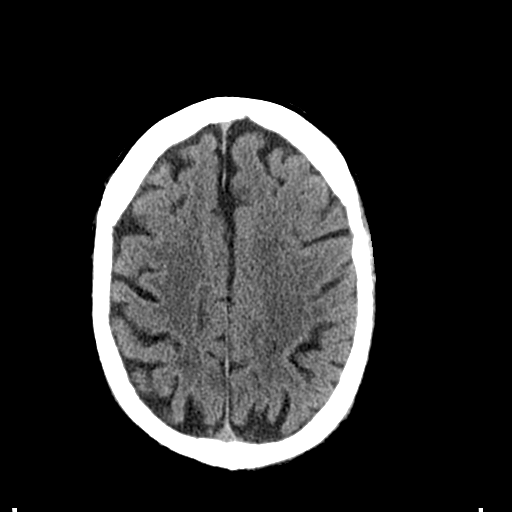
[im 25/32  brain]
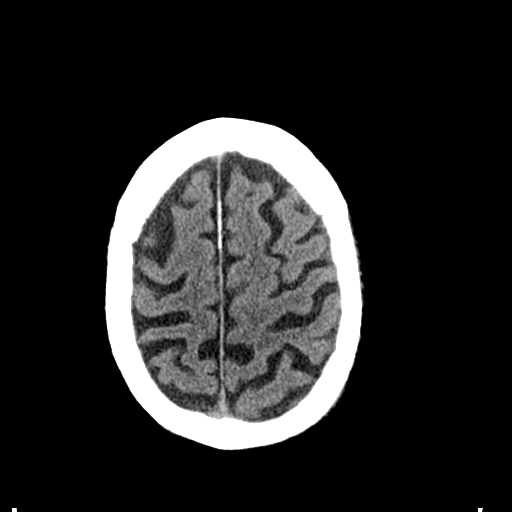
[im 28/32  brain]
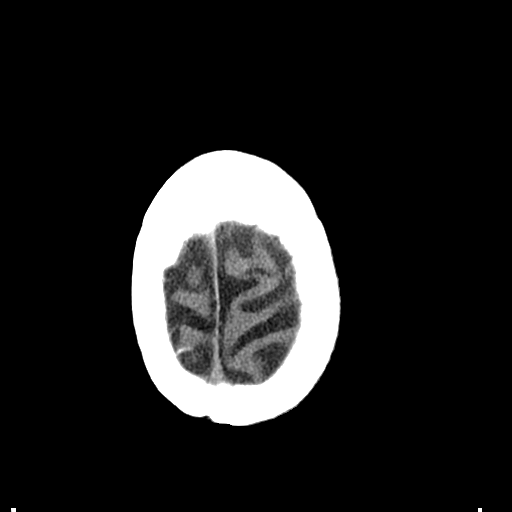
[im 28/32  bone]
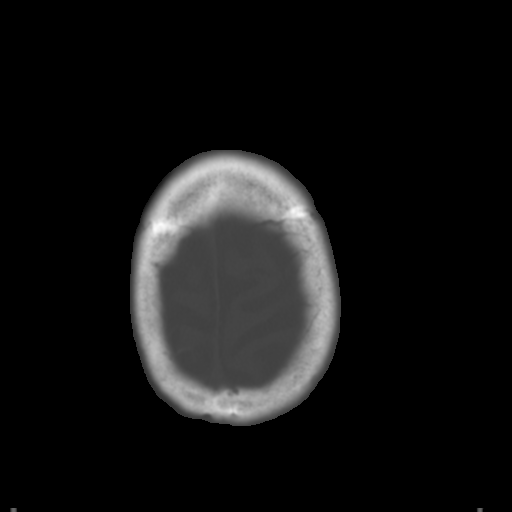

[Series 3: bone windows · axial · 0.48mm/px · z∈[-168,-45]mm · 8 of 53 slices shown]
[im 6/53  bone]
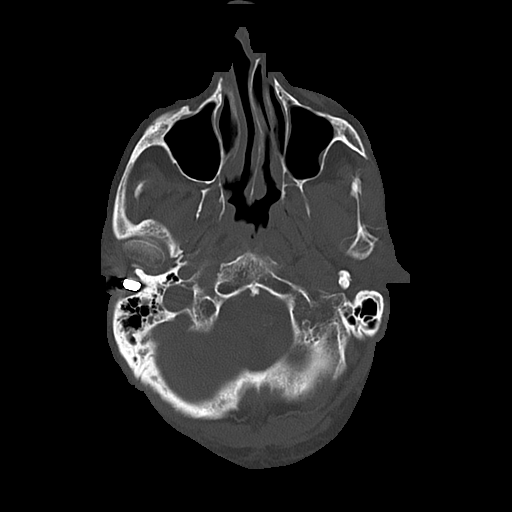
[im 12/53  bone]
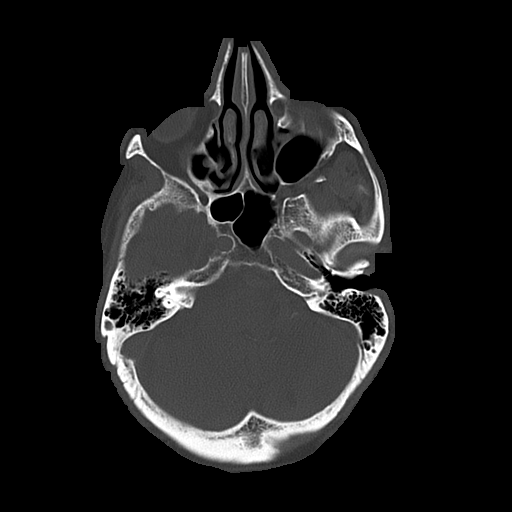
[im 18/53  bone]
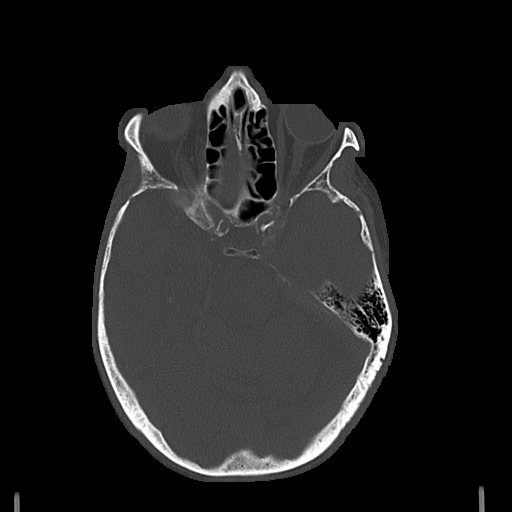
[im 24/53  bone]
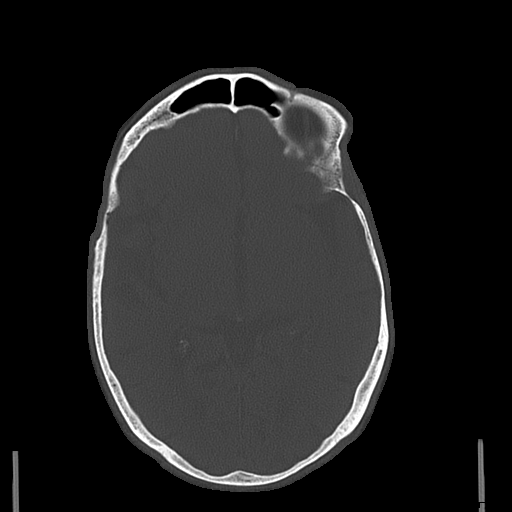
[im 29/53  bone]
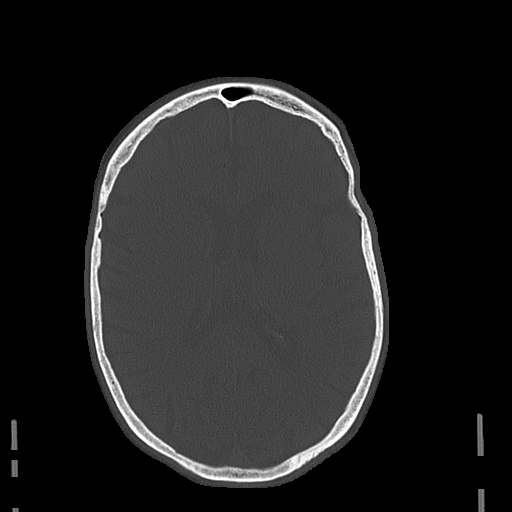
[im 35/53  bone]
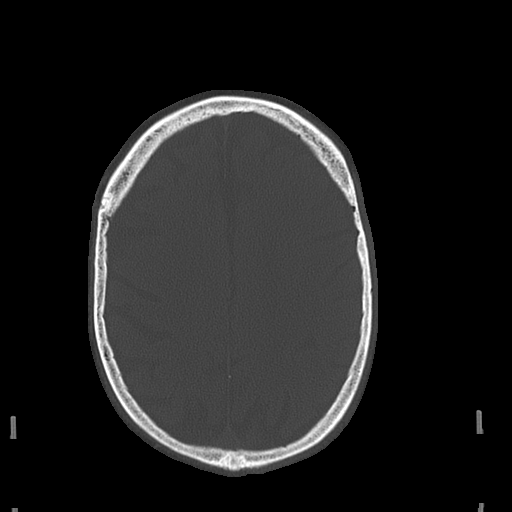
[im 41/53  bone]
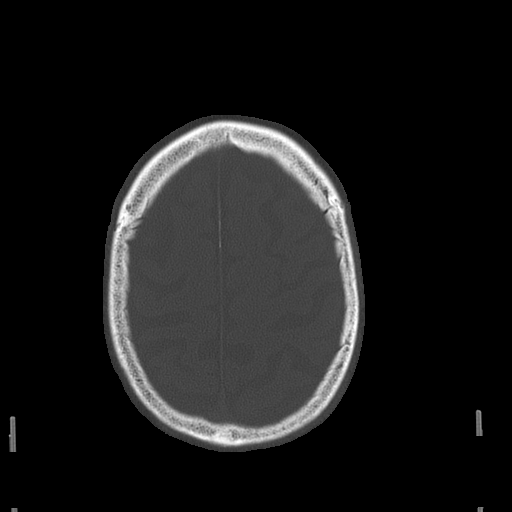
[im 47/53  bone]
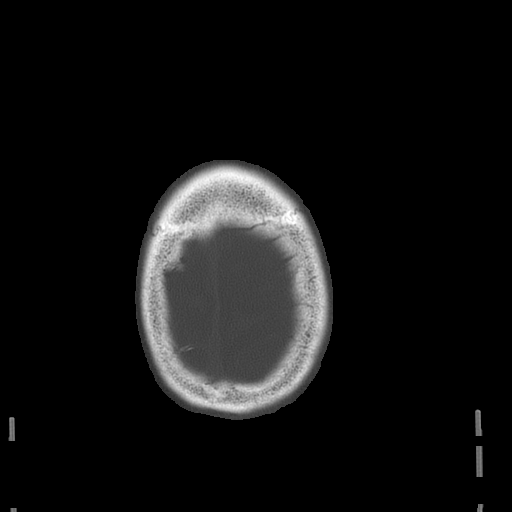

[17 of 30 positions shown; findings below may reference images not displayed]

FINDINGS: Chronic ischemic changes. Atrophy. Trace fluid in the right mastoid
air cells. Mucosal thickening in the left maxillary sinus. Nasal
septum deviated to the left. No mass effect, midline shift, or acute
intracranial hemorrhage.
IMPRESSION: No acute intracranial pathology.
